# Patient Record
Sex: Male | Born: 1975 | Race: White | Hispanic: No | Marital: Married | Smoking: Former smoker
Health system: Southern US, Community
[De-identification: ages and names within clinical notes are randomized; demographics above are authoritative.]

## PROBLEM LIST (undated history)

## (undated) DIAGNOSIS — M069 Rheumatoid arthritis, unspecified: Secondary | ICD-10-CM

## (undated) DIAGNOSIS — F32A Depression, unspecified: Secondary | ICD-10-CM

## (undated) DIAGNOSIS — T7840XA Allergy, unspecified, initial encounter: Secondary | ICD-10-CM

## (undated) HISTORY — DX: Allergy, unspecified, initial encounter: T78.40XA

## (undated) HISTORY — DX: Depression, unspecified: F32.A

## (undated) HISTORY — DX: Rheumatoid arthritis, unspecified: M06.9

---

## 2008-06-27 HISTORY — PX: VASECTOMY: SHX75

## 2017-07-24 DIAGNOSIS — F32A Depression, unspecified: Secondary | ICD-10-CM | POA: Insufficient documentation

## 2017-07-24 DIAGNOSIS — R110 Nausea: Secondary | ICD-10-CM | POA: Insufficient documentation

## 2017-07-24 DIAGNOSIS — E161 Other hypoglycemia: Secondary | ICD-10-CM | POA: Insufficient documentation

## 2018-03-20 DIAGNOSIS — Z79899 Other long term (current) drug therapy: Secondary | ICD-10-CM | POA: Diagnosis not present

## 2018-03-20 DIAGNOSIS — F3342 Major depressive disorder, recurrent, in full remission: Secondary | ICD-10-CM | POA: Insufficient documentation

## 2018-03-20 DIAGNOSIS — F5102 Adjustment insomnia: Secondary | ICD-10-CM | POA: Diagnosis not present

## 2018-03-20 DIAGNOSIS — Z23 Encounter for immunization: Secondary | ICD-10-CM | POA: Diagnosis not present

## 2018-03-20 DIAGNOSIS — M069 Rheumatoid arthritis, unspecified: Secondary | ICD-10-CM | POA: Diagnosis not present

## 2018-05-18 DIAGNOSIS — Z6826 Body mass index (BMI) 26.0-26.9, adult: Secondary | ICD-10-CM | POA: Diagnosis not present

## 2018-05-18 DIAGNOSIS — R5383 Other fatigue: Secondary | ICD-10-CM | POA: Diagnosis not present

## 2018-05-18 DIAGNOSIS — M542 Cervicalgia: Secondary | ICD-10-CM | POA: Diagnosis not present

## 2018-05-18 DIAGNOSIS — M0600 Rheumatoid arthritis without rheumatoid factor, unspecified site: Secondary | ICD-10-CM | POA: Diagnosis not present

## 2018-05-18 DIAGNOSIS — E663 Overweight: Secondary | ICD-10-CM | POA: Diagnosis not present

## 2018-06-28 DIAGNOSIS — M797 Fibromyalgia: Secondary | ICD-10-CM | POA: Diagnosis not present

## 2018-06-28 DIAGNOSIS — M0600 Rheumatoid arthritis without rheumatoid factor, unspecified site: Secondary | ICD-10-CM | POA: Diagnosis not present

## 2018-06-28 DIAGNOSIS — R945 Abnormal results of liver function studies: Secondary | ICD-10-CM | POA: Diagnosis not present

## 2018-06-28 DIAGNOSIS — M150 Primary generalized (osteo)arthritis: Secondary | ICD-10-CM | POA: Diagnosis not present

## 2019-01-12 ENCOUNTER — Other Ambulatory Visit: Payer: Self-pay | Admitting: Critical Care Medicine

## 2019-01-12 DIAGNOSIS — Z20822 Contact with and (suspected) exposure to covid-19: Secondary | ICD-10-CM

## 2019-01-16 LAB — NOVEL CORONAVIRUS, NAA: SARS-CoV-2, NAA: NOT DETECTED
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
## 2020-03-25 ENCOUNTER — Encounter: Payer: Self-pay | Admitting: Family Medicine

## 2020-03-25 ENCOUNTER — Other Ambulatory Visit: Payer: Self-pay

## 2020-03-25 ENCOUNTER — Ambulatory Visit (INDEPENDENT_AMBULATORY_CARE_PROVIDER_SITE_OTHER): Payer: Commercial Managed Care - PPO | Admitting: Family Medicine

## 2020-03-25 VITALS — BP 120/70 | HR 84 | Ht 71.0 in | Wt 193.0 lb

## 2020-03-25 DIAGNOSIS — M069 Rheumatoid arthritis, unspecified: Secondary | ICD-10-CM

## 2020-03-25 DIAGNOSIS — M999 Biomechanical lesion, unspecified: Secondary | ICD-10-CM | POA: Diagnosis not present

## 2020-03-25 DIAGNOSIS — G8929 Other chronic pain: Secondary | ICD-10-CM

## 2020-03-25 DIAGNOSIS — M5030 Other cervical disc degeneration, unspecified cervical region: Secondary | ICD-10-CM | POA: Insufficient documentation

## 2020-03-25 DIAGNOSIS — M542 Cervicalgia: Secondary | ICD-10-CM | POA: Diagnosis not present

## 2020-03-25 DIAGNOSIS — M0600 Rheumatoid arthritis without rheumatoid factor, unspecified site: Secondary | ICD-10-CM | POA: Insufficient documentation
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
## 2020-03-25 NOTE — Patient Instructions (Addendum)
Good to see you Scapular exercises Neck xray on the way out  Turmeric 500mg  daily  Tart cherry extract 1200mg  at night Vitamin D 2000 IU daily  everyly well for food sensitivity See me again in 5-6 weeks

## 2020-03-25 NOTE — Assessment & Plan Note (Signed)
Per patient.  Will await other notes to further evaluate

## 2020-03-25 NOTE — Assessment & Plan Note (Signed)
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  X-rays are pending.  Underlying rheumatoid arthritis does make it a little complicated.  Not taking significant number of medications at this moment.  We discussed over-the-counter medicines and patient would want to do that.  Has difficulty with gabapentin with hallucinations.  Patient will work on Financial controller.  Work with Event organiser, follow-up again in 4 to 6 weeks.  Responded well to manipulation.

## 2020-03-27 ENCOUNTER — Ambulatory Visit (INDEPENDENT_AMBULATORY_CARE_PROVIDER_SITE_OTHER): Payer: Commercial Managed Care - PPO

## 2020-03-27 DIAGNOSIS — G8929 Other chronic pain: Secondary | ICD-10-CM

## 2020-03-27 DIAGNOSIS — M542 Cervicalgia: Secondary | ICD-10-CM

## 2020-04-30 ENCOUNTER — Encounter: Payer: Self-pay | Admitting: Family Medicine

## 2020-04-30 ENCOUNTER — Ambulatory Visit (INDEPENDENT_AMBULATORY_CARE_PROVIDER_SITE_OTHER): Payer: Commercial Managed Care - PPO | Admitting: Family Medicine

## 2020-04-30 ENCOUNTER — Other Ambulatory Visit: Payer: Self-pay

## 2020-04-30 VITALS — BP 110/68 | HR 71 | Ht 71.0 in | Wt 193.0 lb

## 2020-04-30 DIAGNOSIS — M5030 Other cervical disc degeneration, unspecified cervical region: Secondary | ICD-10-CM

## 2020-04-30 DIAGNOSIS — M999 Biomechanical lesion, unspecified: Secondary | ICD-10-CM | POA: Diagnosis not present
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
## 2020-04-30 NOTE — Assessment & Plan Note (Signed)
Patient does have some degenerative disc disease.  X-rays did show mild to moderate.  Patient does have some mild radicular symptoms but appears.  He did not respond extremely well to osteopathic manipulation last time he states.  We will try again and see how patient does.  Encouraged him to continue the exercises and the vitamin supplementation otherwise we will need to consider the possibility of prescription medications.  Follow-up with me again in 6 to 8 weeks
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
## 2020-04-30 NOTE — Patient Instructions (Signed)
Thanks for tip on ear buds Overall keep being active No more headstands No big changes at this time See me in 6-8 weeks
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
## 2020-06-05 ENCOUNTER — Other Ambulatory Visit: Payer: Self-pay

## 2020-06-05 ENCOUNTER — Ambulatory Visit (INDEPENDENT_AMBULATORY_CARE_PROVIDER_SITE_OTHER): Payer: Commercial Managed Care - PPO | Admitting: Family Medicine

## 2020-06-05 ENCOUNTER — Encounter: Payer: Self-pay | Admitting: Family Medicine

## 2020-06-05 VITALS — BP 118/68 | HR 98 | Ht 71.0 in | Wt 191.0 lb

## 2020-06-05 DIAGNOSIS — M999 Biomechanical lesion, unspecified: Secondary | ICD-10-CM

## 2020-06-05 DIAGNOSIS — M5030 Other cervical disc degeneration, unspecified cervical region: Secondary | ICD-10-CM
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
## 2020-06-05 NOTE — Patient Instructions (Signed)
Keep active Keep working posture See me in 2 months

## 2020-06-18 ENCOUNTER — Other Ambulatory Visit: Payer: Self-pay

## 2020-06-18 DIAGNOSIS — Z20822 Contact with and (suspected) exposure to covid-19: Secondary | ICD-10-CM

## 2020-06-20 LAB — SARS-COV-2, NAA 2 DAY TAT

## 2020-06-20 LAB — NOVEL CORONAVIRUS, NAA: SARS-CoV-2, NAA: NOT DETECTED

## 2020-08-13 NOTE — Progress Notes (Signed)
[facility] [address] [place] [address] Phone: [phone] Subjective:   Darrell Jordan, am serving as a scribe for Dr. [name]. This visit occurred during the SARS-CoV-2 public health emergency.  Safety protocols were in place, including screening questions prior to the visit, additional usage of staff PPE, and extensive cleaning of exam room while observing appropriate contact time as indicated for disinfecting solutions.   I'm seeing this patient by the request  of:  Roger Kill, PA-C  CC: neck pain follow up   [id number]  Darrell Jordan is a 45 y.o. male coming in with complaint of back and neck pain. OMT 06/05/2020. Patient states that his neck pain was doing great until this week. Noticed that black beans and Chiptle has made his neck hurt more this week.   Medications patient has been prescribed: None          Reviewed prior external information including notes and imaging from previsou exam, outside providers and external EMR if available.   As well as notes that were available from care everywhere and other healthcare systems.  Past medical history, social, surgical and family history all reviewed in electronic medical record.  No pertanent information unless stated regarding to the chief complaint.   No past medical history on file.  Allergies  Allergen Reactions  . Gabapentin      Review of Systems:  No headache, visual changes, nausea, vomiting, diarrhea, constipation, dizziness, abdominal pain, skin rash, fevers, chills, night sweats, weight loss, swollen lymph nodes, body aches, joint swelling, chest pain, shortness of breath, mood changes. POSITIVE muscle aches  Objective  Blood pressure 122/80, pulse 72, height 5\' 11"  (1.803 m), weight 196 lb (88.9 kg), SpO2 96 %.   General: No apparent distress alert and oriented x3 mood and affect normal, dressed appropriately.  HEENT: Pupils equal, extraocular  movements intact  Respiratory: Patient's speak in full sentences and does not appear short of breath  Cardiovascular: No lower extremity edema, non tender, no erythema  Neuro: Cranial nerves II through XII are intact, neurovascularly intact in all extremities with 2+ DTRs and 2+ pulses.  Gait normal with good balance and coordination.  MSK:  Non tender with full range of motion and good stability and symmetric strength and tone of shoulders, elbows, wrist, hip, knee and ankles bilaterally.   neck exam   Osteopathic findings  C2 flexed rotated and side bent right C6 flexed rotated and side bent left T3 extended rotated and side bent right inhaled rib T9 extended rotated and side bent left        Assessment and Plan:  Degenerative cervical disc Mild exacerbation of a underlying chronic problem.  Patient does have history of rheumatoid arthritis but I do not think patient is having a flare.  Patient responded well to muscle energy.  Likely more secondary to recent stress, potentially diet, and weather changes.  We will continue conservative therapy including osteopathic manipulation with a short course of anti-inflammatories and follow-up with me again in 4 to 6 weeks and then hopefully space out back to 8-week intervals.    Nonallopathic problems  Decision today to treat with OMT was based on Physical Exam  After verbal consent patient was treated with ME, FPR techniques in cervical, rib, thoracic areas  Patient tolerated the procedure well with improvement in symptoms  Patient given exercises, stretches and lifestyle modifications  See medications in patient instructions if given  Patient will follow up  in 4-6 weeks      The above documentation has been reviewed and is accurate and complete Judi Saa, DO       Note: This dictation was prepared with Dragon dictation along with smaller phrase technology. Any transcriptional errors that result from this process are  unintentional.

## 2020-08-14 ENCOUNTER — Other Ambulatory Visit: Payer: Self-pay

## 2020-08-14 ENCOUNTER — Encounter: Payer: Self-pay | Admitting: Family Medicine

## 2020-08-14 ENCOUNTER — Ambulatory Visit: Payer: 59 | Admitting: Family Medicine

## 2020-08-14 VITALS — BP 122/80 | HR 72 | Ht 71.0 in | Wt 196.0 lb

## 2020-08-14 DIAGNOSIS — M5030 Other cervical disc degeneration, unspecified cervical region: Secondary | ICD-10-CM | POA: Diagnosis not present

## 2020-08-14 DIAGNOSIS — M999 Biomechanical lesion, unspecified: Secondary | ICD-10-CM

## 2020-08-14 NOTE — Patient Instructions (Signed)
2 IBU 2x a day for 3 days Go to town with massage on right side of neck Consider yoga wheel Have other doc check uric acid See me in 4-6 weeks

## 2020-08-14 NOTE — Assessment & Plan Note (Signed)
Mild exacerbation of a underlying chronic problem.  Patient does have history of rheumatoid arthritis but I do not think patient is having a flare.  Patient responded well to muscle energy.  Likely more secondary to recent stress, potentially diet, and weather changes.  We will continue conservative therapy including osteopathic manipulation with a short course of anti-inflammatories and follow-up with me again in 4 to 6 weeks and then hopefully space out back to 8-week intervals.

## 2020-09-23 NOTE — Progress Notes (Signed)
[name] [address] [place] [address] Phone: [phone] Subjective:   Darrell Jordan, am serving as a scribe for Dr. [name]. This visit occurred during the SARS-CoV-2 public health emergency.  Safety protocols were in place, including screening questions prior to the visit, additional usage of staff PPE, and extensive cleaning of exam room while observing appropriate contact time as indicated for disinfecting solutions.   I'm seeing this patient by the request  of:  Roger Kill, PA-C  CC: Back pain and neck pain follow-up  NIO:[id number]  Darrell Jordan is a 45 y.o. male coming in with complaint of back and neck pain. OMT 08/14/2020. Patient states that his pain throughout his body has been more the past few weeks as well as fatigued.  Patient does have known rheumatoid arthritis.  Patient states that he would like to discuss potential care with another rheumatologist.  Patient just does not feel like himself.  Medications patient has been prescribed: None          Reviewed prior external information including notes and imaging from previsou exam, outside providers and external EMR if available.   As well as notes that were available from care everywhere and other healthcare systems.  Past medical history, social, surgical and family history all reviewed in electronic medical record.  No pertanent information unless stated regarding to the chief complaint.   No past medical history on file.  Allergies  Allergen Reactions  . Gabapentin      Review of Systems:  No headache, visual changes, nausea, vomiting, diarrhea, constipation, dizziness, abdominal pain, skin rash, fevers, chills, night sweats, weight loss, swollen lymph nodes, joint swelling, chest pain, shortness of breath, mood changes. POSITIVE muscle aches, body aches, fatigue  Objective  Blood pressure 120/80, pulse 68, height 5\' 11"  (1.803 m), weight 197 lb  (89.4 kg), SpO2 97 %.   General: No apparent distress alert and oriented x3 mood and affect normal, dressed appropriately.  HEENT: Pupils equal, extraocular movements intact  Respiratory: Patient's speak in full sentences and does not appear short of breath  Cardiovascular: No lower extremity edema, non tender, no erythema  Gait normal with good balance and coordination.  MSK: Patient does have some mild synovitis of the hands bilaterally. Back -low back exam shows mild loss of lordosis.  Patient is minorly tender overall. Neck exam does have tightness noted.  Negative Spurling's.  Patient has 5 out of 5 strength of the hands the patient does have some mild swelling in his heels with some mild synovitis of the hands.  Neurovascularly intact.  Good capillary refill.  Good strength at the shoulders.  Mild limitation of extension of the neck  Osteopathic findings  C6 flexed rotated and side bent left T3 extended rotated and side bent right inhaled rib T9 extended rotated and side bent left L2 flexed rotated and side bent right Sacrum right on right       Assessment and Plan:  Rheumatoid arthritis (HCC) History of rheumatoid arthritis.  Originally diagnosed in .  Has been seen at [facility]  Seeing different providers before.  We will get more repeat labs to see if patient is having a potential flare.  Would like to refer patient to another rheumatologist for another opinion and treatment.  Patient does have mild increase in swelling.  We will start meloxicam and examining the patient would like to keep at very low doses and not doing long-term.  Follow-up with  me again in 4 weeks    Nonallopathic problems  Decision today to treat with OMT was based on Physical Exam  After verbal consent patient was treated with HVLA, ME, FPR techniques in cervical, rib, thoracic, lumbar, and sacral  areas avoided HVLA on the neck values more muscle energy  Patient tolerated the procedure well  with improvement in symptoms  Patient given exercises, stretches and lifestyle modifications  See medications in patient instructions if given  Patient will follow up in 4-8 weeks      The above documentation has been reviewed and is accurate and complete Judi Saa, DO       Note: This dictation was prepared with Dragon dictation along with smaller phrase technology. Any transcriptional errors that result from this process are unintentional.

## 2020-09-24 ENCOUNTER — Ambulatory Visit: Payer: 59 | Admitting: Family Medicine

## 2020-09-24 ENCOUNTER — Other Ambulatory Visit: Payer: Self-pay

## 2020-09-24 ENCOUNTER — Encounter: Payer: Self-pay | Admitting: Family Medicine

## 2020-09-24 VITALS — BP 120/80 | HR 68 | Ht 71.0 in | Wt 197.0 lb

## 2020-09-24 DIAGNOSIS — T7840XD Allergy, unspecified, subsequent encounter: Secondary | ICD-10-CM | POA: Diagnosis not present

## 2020-09-24 DIAGNOSIS — M999 Biomechanical lesion, unspecified: Secondary | ICD-10-CM

## 2020-09-24 DIAGNOSIS — M069 Rheumatoid arthritis, unspecified: Secondary | ICD-10-CM

## 2020-09-24 DIAGNOSIS — M2550 Pain in unspecified joint: Secondary | ICD-10-CM

## 2020-09-24 LAB — CBC WITH DIFFERENTIAL/PLATELET
Basophils Absolute: 0.1 10*3/uL (ref 0.0–0.1)
Basophils Relative: 1.1 % (ref 0.0–3.0)
Eosinophils Absolute: 0.1 10*3/uL (ref 0.0–0.7)
Eosinophils Relative: 0.8 % (ref 0.0–5.0)
HCT: 45.1 % (ref 39.0–52.0)
Hemoglobin: 15.3 g/dL (ref 13.0–17.0)
Lymphocytes Relative: 12.3 % (ref 12.0–46.0)
Lymphs Abs: 1 10*3/uL (ref 0.7–4.0)
MCHC: 34 g/dL (ref 30.0–36.0)
MCV: 94.6 fl (ref 78.0–100.0)
Monocytes Absolute: 0.5 10*3/uL (ref 0.1–1.0)
Monocytes Relative: 6.1 % (ref 3.0–12.0)
Neutro Abs: 6.4 10*3/uL (ref 1.4–7.7)
Neutrophils Relative %: 79.7 % — ABNORMAL HIGH (ref 43.0–77.0)
Platelets: 140 10*3/uL — ABNORMAL LOW (ref 150.0–400.0)
RBC: 4.77 Mil/uL (ref 4.22–5.81)
RDW: 12.4 % (ref 11.5–15.5)
WBC: 8.1 10*3/uL (ref 4.0–10.5)

## 2020-09-24 LAB — TESTOSTERONE: Testosterone: 378.52 ng/dL (ref 300.00–890.00)

## 2020-09-24 LAB — VITAMIN B12: Vitamin B-12: 237 pg/mL (ref 211–911)

## 2020-09-24 LAB — IBC PANEL
Iron: 90 ug/dL (ref 42–165)
Saturation Ratios: 22.7 % (ref 20.0–50.0)
Transferrin: 283 mg/dL (ref 212.0–360.0)

## 2020-09-24 LAB — URIC ACID: Uric Acid, Serum: 6.9 mg/dL (ref 4.0–7.8)

## 2020-09-24 LAB — SEDIMENTATION RATE: Sed Rate: 5 mm/hr (ref 0–15)

## 2020-09-24 LAB — TSH: TSH: 1.91 u[IU]/mL (ref 0.35–4.50)

## 2020-09-24 LAB — PSA: PSA: 0.5 ng/mL (ref 0.10–4.00)

## 2020-09-24 LAB — VITAMIN D 25 HYDROXY (VIT D DEFICIENCY, FRACTURES): VITD: 24.52 ng/mL — ABNORMAL LOW (ref 30.00–100.00)

## 2020-09-24 MED ORDER — TIZANIDINE HCL 2 MG PO TABS: 2.0000 mg | ORAL_TABLET | Freq: Every day | ORAL | 0 refills | Status: DC

## 2020-09-24 MED ORDER — MELOXICAM 7.5 MG PO TABS
7.5000 mg | ORAL_TABLET | Freq: Every day | ORAL | 0 refills | Status: DC
Start: 2020-09-24 — End: 2020-11-26

## 2020-09-24 NOTE — Addendum Note (Signed)
Addended by: Madelon Lips on: 09/24/2020 11:32 AM   Modules accepted: Orders

## 2020-09-24 NOTE — Assessment & Plan Note (Signed)
History of rheumatoid arthritis.  Originally diagnosed in [place].  Has been seen at [facility].  Seeing different providers before.  We will get more repeat labs to see if patient is having a potential flare.  Would like to refer patient to another rheumatologist for another opinion and treatment.  Patient does have mild increase in swelling.  We will start meloxicam and examining the patient would like to keep at very low doses and not doing long-term.  Follow-up with me again in 4 weeks

## 2020-09-24 NOTE — Patient Instructions (Addendum)
Meloxicam 7.5-10 days in a row then as needed Zanaflex 2mg  at night Rheum will call you Allergy will call you See me again in 4-6 weeks

## 2020-09-24 NOTE — Addendum Note (Signed)
Addended by: Ethlyn Daniels on: 09/24/2020 11:28 AM   Modules accepted: Orders

## 2020-09-25 LAB — ANA: Anti Nuclear Antibody (ANA): NEGATIVE

## 2020-09-25 LAB — PTH, INTACT AND CALCIUM
Calcium: 9.8 mg/dL (ref 8.6–10.3)
PTH: 59 pg/mL (ref 16–77)

## 2020-09-25 LAB — CYCLIC CITRUL PEPTIDE ANTIBODY, IGG: Cyclic Citrullin Peptide Ab: <16 UNITS

## 2020-09-25 LAB — RHEUMATOID FACTOR: Rheumatoid fact SerPl-aCnc: <14 IU/mL (ref <14)

## 2020-10-08 ENCOUNTER — Telehealth: Payer: Self-pay | Admitting: Physician Assistant

## 2020-10-08 NOTE — Telephone Encounter (Signed)
ok 

## 2020-10-08 NOTE — Telephone Encounter (Signed)
Patient wife called and wanted to see if the provider can take husband as a new patient, please advise. CB is [phone]

## 2020-11-02 NOTE — Progress Notes (Signed)
[name] [address] [place] [address] Phone: [phone] Subjective:   Darrell Jordan, am serving as a scribe for Dr. [name]. This visit occurred during the SARS-CoV-2 public health emergency.  Safety protocols were in place, including screening questions prior to the visit, additional usage of staff PPE, and extensive cleaning of exam room while observing appropriate contact time as indicated for disinfecting solutions.   I'm seeing this patient by the request  of:  Roger Kill, PA-C  CC: Neck and back pain follow-up  ION:[id number]  Joeziah Voit is a 45 y.o. male coming in with complaint of back and neck pain. OMT 09/24/2020. Patient states that last week he was not feeling good. Pain increased in neck and L shoulder. Pain improved over weekend after ice and rest. This week his pain is much better. Taking meloxicam prn. Does not like zanaflex as this makes him angry.   Has not been scheduled yet with Dr. Nigel Sloop but they did call him.   Medications patient has been prescribed: Meloxicam, Zanaflex          Reviewed prior external information including notes and imaging from previsou exam, outside providers and external EMR if available.   As well as notes that were available from care everywhere and other healthcare systems.  Past medical history, social, surgical and family history all reviewed in electronic medical record.  No pertanent information unless stated regarding to the chief complaint.   No past medical history on file.  Allergies  Allergen Reactions  . Gabapentin      Review of Systems:  No headache, visual changes, nausea, vomiting, diarrhea, constipation, dizziness, abdominal pain, skin rash, fevers, chills, night sweats, weight loss, swollen lymph nodes, body aches, joint swelling, chest pain, shortness of breath, mood changes. POSITIVE muscle aches  Objective  Blood pressure 114/70, pulse 66,  height 5\' 11"  (1.803 m), weight 201 lb (91.2 kg), SpO2 97 %.   General: No apparent distress alert and oriented x3 mood and affect normal, dressed appropriately.  HEENT: Pupils equal, extraocular movements intact  Respiratory: Patient's speak in full sentences and does not appear short of breath  Cardiovascular: No lower extremity edema, non tender, no erythema .  Gait normal with good balance and coordination.  MSK:  Non tender with full range of motion and good stability and symmetric strength and tone of shoulders, elbows, wrist, hip, knee and ankles bilaterally.  Back-back exam does have some mild loss of lordosis.  Patient will do some more tightness around the neck to left greater than right today.  Seems to be more at the base of the skull.  Patient does have tightness in the left scapular region as well.  Osteopathic findings  C2 flexed rotated and side bent left T4 extended rotated and side bent left inhaled ribht       Assessment and Plan:  Degenerative cervical disc Patient continues to have some tightness.  He does have the muscle relaxer for breakthrough.  Discussed with patient icing regimen.  Awaiting patient to see rheumatology.  Follow-up with me again in 6 to 8 weeks chronic problem with very mild exacerbation.  Rheumatoid arthritis (HCC) Awaiting further evaluation from rheumatology.    Nonallopathic problems  Decision today to treat with OMT was based on Physical Exam  After verbal consent patient was treated with ME, FPR techniques in cervical, rib, thoracic  areas  Patient tolerated the procedure well with improvement in symptoms  Patient  given exercises, stretches and lifestyle modifications  See medications in patient instructions if given  Patient will follow up in 4-8 weeks      The above documentation has been reviewed and is accurate and complete Judi Saa, DO       Note: This dictation was prepared with Dragon dictation along with  smaller phrase technology. Any transcriptional errors that result from this process are unintentional.

## 2020-11-03 ENCOUNTER — Encounter: Payer: Self-pay | Admitting: Family Medicine

## 2020-11-03 ENCOUNTER — Other Ambulatory Visit: Payer: Self-pay

## 2020-11-03 ENCOUNTER — Ambulatory Visit: Payer: 59 | Admitting: Family Medicine

## 2020-11-03 VITALS — BP 114/70 | HR 66 | Ht 71.0 in | Wt 201.0 lb

## 2020-11-03 DIAGNOSIS — M9901 Segmental and somatic dysfunction of cervical region: Secondary | ICD-10-CM

## 2020-11-03 DIAGNOSIS — M9902 Segmental and somatic dysfunction of thoracic region: Secondary | ICD-10-CM | POA: Diagnosis not present

## 2020-11-03 DIAGNOSIS — M5030 Other cervical disc degeneration, unspecified cervical region: Secondary | ICD-10-CM | POA: Diagnosis not present

## 2020-11-03 DIAGNOSIS — M9908 Segmental and somatic dysfunction of rib cage: Secondary | ICD-10-CM | POA: Diagnosis not present

## 2020-11-03 DIAGNOSIS — M069 Rheumatoid arthritis, unspecified: Secondary | ICD-10-CM

## 2020-11-03 NOTE — Assessment & Plan Note (Signed)
Patient continues to have some tightness.  He does have the muscle relaxer for breakthrough.  Discussed with patient icing regimen.  Awaiting patient to see rheumatology.  Follow-up with me again in 6 to 8 weeks chronic problem with very mild exacerbation.

## 2020-11-03 NOTE — Assessment & Plan Note (Signed)
Awaiting further evaluation from rheumatology.

## 2020-11-03 NOTE — Patient Instructions (Signed)
Just a little inflamed Let's not make any changes See other docs See me in 6-8 weeks

## 2020-11-26 ENCOUNTER — Other Ambulatory Visit: Payer: Self-pay | Admitting: Family Medicine

## 2020-11-26 ENCOUNTER — Other Ambulatory Visit: Payer: Self-pay | Admitting: Allergy & Immunology

## 2020-11-26 ENCOUNTER — Encounter: Payer: Self-pay | Admitting: Allergy & Immunology

## 2020-11-26 ENCOUNTER — Other Ambulatory Visit: Payer: Self-pay

## 2020-11-26 ENCOUNTER — Telehealth: Payer: Self-pay

## 2020-11-26 ENCOUNTER — Ambulatory Visit (INDEPENDENT_AMBULATORY_CARE_PROVIDER_SITE_OTHER): Payer: 59 | Admitting: Allergy & Immunology

## 2020-11-26 VITALS — BP 118/60 | HR 58 | Temp 98.4°F | Resp 18 | Ht 71.0 in | Wt 195.2 lb

## 2020-11-26 DIAGNOSIS — L301 Dyshidrosis [pompholyx]: Secondary | ICD-10-CM | POA: Diagnosis not present

## 2020-11-26 DIAGNOSIS — J4520 Mild intermittent asthma, uncomplicated: Secondary | ICD-10-CM | POA: Diagnosis not present

## 2020-11-26 DIAGNOSIS — J310 Chronic rhinitis: Secondary | ICD-10-CM | POA: Diagnosis not present

## 2020-11-26 DIAGNOSIS — T7800XD Anaphylactic reaction due to unspecified food, subsequent encounter: Secondary | ICD-10-CM

## 2020-11-26 MED ORDER — TRIAMCINOLONE ACETONIDE 0.1 % EX OINT: 1.0000 "application " | TOPICAL_OINTMENT | Freq: Two times a day (BID) | CUTANEOUS | 2 refills | Status: DC | PRN

## 2020-11-26 MED ORDER — MELOXICAM 7.5 MG PO TABS: 7.5000 mg | ORAL_TABLET | Freq: Every day | ORAL | 0 refills | Status: DC

## 2020-11-26 NOTE — Progress Notes (Signed)
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 He has a history of multiple food allergies, but the time course of the reactions (4-6 hours after eating) is not truly consistent with an IgE-mediated process. Regardless, we are going to perform some bloodwork to check on his IgE levels to his triggering foods. I would like to get his outside records as well, so we will do that. He has not gotten an up-to-date epinephrine autoinjector in quite some time, so we will arrange for him to you get an Auvi-Q. Regarding his breathing, his symptoms seem to be under control with albuterol as needed.  I do not think there is an indication for controller medication at this time.  Regarding his " tired immune system", I believe he is more concerned about his rheumatoid arthritis.  It does not seem that he has any recurrent infections, so I am going to defer on lab work for that today.   Plan/Recommendations:   1. Anaphylactic shock due to food - We are going to get lab work to look for evidence of new allergies and see where you other allergy levels are hanging out. - We are also getting a tryptase to make sure that you do not have any mast cell issues (which I think is unlikely).  - We will call you in 1-2 weeks with the results of the testing. Darrell Jordan training provided. - Anaphylaxis management plan provided.   2. Mild intermittent asthma, uncomplicated - Lung testing looks awesome - Continue with albuterol as needed. - There is no need for a controller medication at this time.  3.  Environmental allergies - We are getting an environmental allergy panel since your symptoms have been so bad this year.   4. Dyshidrotic eczema - Start triamcinolone 0.1% ointment twice daily as needed to the rash on his fingers. - Provided information on dyshidrotic eczema.  5. Return in about 6 months (around 05/28/2021).    This note in its entirety was forwarded to the Provider who requested this consultation.  Subjective:   Darrell Jordan is a 45 y.o. male presenting today for evaluation of  Chief Complaint  Patient presents with  . Allergy Testing    Darrell Jordan has a history of the following: Patient Active Problem List   Diagnosis Date Noted  . Dyshidrotic eczema 11/28/2020  . Chronic rhinitis 11/28/2020  . Mild intermittent asthma, uncomplicated 11/28/2020  . Anaphylactic shock due to adverse food reaction 11/28/2020  . Degenerative cervical disc 03/25/2020  . Nonallopathic lesion of cervical region 03/25/2020  . Nonallopathic lesion of thoracic region 03/25/2020  . Nonallopathic lesion of rib cage 03/25/2020  . Rheumatoid arthritis (HCC) 03/25/2020    History obtained from: chart review and patient.  Darrell Jordan was referred by Wynn Banker, MD.     [name] is a 45 y.o. male presenting for an evaluation of food allergies.   Asthma/Respiratory Symptom History: He does have a history of wheezing occasionally.  This seems to be only a problem with  viral upper respiratory infections.  He has never been on a controller medication.  He  has an albuterol inhaler to use on a as needed basis.  ACT score is 25 today, indicating excellent asthma control.  Allergic Rhinitis Symptom History: He did have pollen allergies that were worse than normal. He uses a Netti pot and Zyrtec during the owrst times of the year. He doesn ot get sinus infections. In April he was ill and did not have COVID and had a terrible head cold. Allermax and Mucinex D knocked it out.   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 Corn and egg entered the picture in 2016 or 2017. This is when he started having symptoms of RA. Soy and peanut results in really terrible diarrhea for two weeks. Prior to that, he had vomiting 4-5 hours after eating the food. It is never within 30-60 minutes. Last testing was done in [place]. This was skin testing and he evidently had anaphylaxis from the testing.  Similar to his food reactions, the anaphylaxis did not occur until 4 to 6 hours after the testing.  It is unclear whether this was an asthma and allergy practice or some different practice.  He started breaking out again in February or March 2022. He is meticulous about his food.  He is unsure of the trigger at this point.  He also reports today that his "immune system is tired". He started having fatigue randomly. [facility]. He has a history of multiple injuries which is why he follows with Dr. Katrinka Jordan. RA symptoms started showing up after a neck injury when he lived in [place]. He has had some increased levels of pain.  Evidently, hearing more about his RA diagnosis, he is now off of all Biologics for this including methotrexate.  He does have meloxicam to use as needed as well as Zanaflex. He avoids "pro-inflammatory" foods.  He has been referred for another rheumatology evaluation to see if there is some other treatment options.   Otherwise, there is no history of other atopic diseases, including food allergies, drug allergies, stinging insect allergies, urticaria or contact dermatitis. There is no significant infectious history. Vaccinations are up to date.    Past Medical History: Patient Active Problem List   Diagnosis Date Noted  . Dyshidrotic eczema 11/28/2020  . Chronic rhinitis 11/28/2020  . Mild intermittent asthma, uncomplicated 11/28/2020  . Anaphylactic shock due to adverse food  reaction 11/28/2020  . Degenerative cervical disc 03/25/2020  . Nonallopathic lesion of cervical region 03/25/2020  . Nonallopathic lesion of thoracic region 03/25/2020  . Nonallopathic lesion of rib cage 03/25/2020  . Rheumatoid arthritis (HCC) 03/25/2020    Medication List:  Allergies as of 11/26/2020      Reactions   Gabapentin       Medication List       Accurate as of November 26, 2020 11:59 PM. If you have any questions, ask your nurse or doctor.        albuterol 108 (90 Base) MCG/ACT inhaler Commonly known as: VENTOLIN HFA Inhale 2 puffs into the lungs every 6 (six) hours as needed.   meloxicam 7.5 MG tablet Commonly known as: MOBIC Take 1 tablet (7.5 mg total) by mouth daily.   tiZANidine 2 MG tablet Commonly known as: ZANAFLEX Take 1 tablet (2 mg total) by mouth at bedtime.   triamcinolone ointment 0.1 % Commonly known as: KENALOG Apply 1 application topically 2 (two) times daily as needed.  Started by: Alfonse Spruce, MD       Birth History: non-contributory  Developmental History: non-contributory  Past Surgical History: History reviewed. No pertinent surgical history.   Family History: History reviewed. No pertinent family history.   Social History: Shaman lives at home with his family.  He lives in a house that is 46 years old.  There is wood throughout the home.  He has a heat pump for heating and central cooling.  There are cats inside of the home.  There are no dust mite covers on the bedding.  There is no tobacco exposure.  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  Evidently there are 2 large companies that make mobile medical equipment and they are both located here in [place].  He seems to really enjoy his job.  He is a never smoker.  He does use a HEPA filter in the home.  He is exposed to fumes and chemicals in his workplace, but not at home.   Review of Systems  Constitutional: Negative.  Negative  for chills, fever, malaise/fatigue and weight loss.  HENT: Negative for congestion, ear discharge, ear pain and sinus pain.   Eyes: Negative for pain, discharge and redness.  Respiratory: Negative for cough, sputum production, shortness of breath and wheezing.   Cardiovascular: Negative.  Negative for chest pain and palpitations.  Gastrointestinal: Negative for abdominal pain, constipation, diarrhea, heartburn, nausea and vomiting.  Skin: Negative.  Negative for itching and rash.  Neurological: Negative for dizziness and headaches.  Endo/Heme/Allergies: Positive for environmental allergies. Does not bruise/bleed easily.       Positive for food allergies.       Objective:   Blood pressure 118/60, pulse (!) 58, temperature 98.4 F (36.9 C), temperature source Temporal, resp. rate 18, height  (1.803 m), weight 195 lb 3.2 oz (88.5 kg), SpO2 98 %. Body mass index is 27.22 kg/m.   Physical Exam:   Physical Exam Constitutional:      Appearance: He is well-developed.     Comments: Very pleasant male. Cooperative with the exam.   HENT:     Head: Normocephalic and atraumatic.     Right Ear: Tympanic membrane, ear canal and external ear normal. No drainage, swelling or tenderness. Tympanic membrane is not injected, scarred, erythematous, retracted or bulging.     Left Ear: Tympanic membrane, ear canal and external ear normal. No drainage, swelling or tenderness. Tympanic membrane is not injected, scarred, erythematous, retracted or bulging.     Nose: No nasal deformity, septal deviation, mucosal edema or rhinorrhea.     Right Turbinates: Enlarged and swollen.     Left Turbinates: Enlarged and swollen.     Right Sinus: No maxillary sinus tenderness or frontal sinus tenderness.     Left Sinus: No maxillary sinus tenderness or frontal sinus tenderness.     Mouth/Throat:     Mouth: Mucous membranes are not pale and not dry.     Pharynx: Uvula midline.  Eyes:     General:         Right eye: No discharge.        Left eye: No discharge.     Conjunctiva/sclera: Conjunctivae normal.     Right eye: Right conjunctiva is not injected. No chemosis.    Left eye: Left conjunctiva is not injected. No chemosis.    Pupils: Pupils are equal, round, and reactive to light.  Cardiovascular:     Rate and Rhythm:  Normal rate and regular rhythm.     Heart sounds: Normal heart sounds.  Pulmonary:     Effort: Pulmonary effort is normal. No tachypnea, accessory muscle usage or respiratory distress.     Breath sounds: Normal breath sounds. No wheezing, rhonchi or rales.  Chest:     Chest wall: No tenderness.  Abdominal:     Tenderness: There is no abdominal tenderness. There is no guarding or rebound.  Lymphadenopathy:     Head:     Right side of head: No submandibular, tonsillar or occipital adenopathy.     Left side of head: No submandibular, tonsillar or occipital adenopathy.     Cervical: No cervical adenopathy.  Skin:    Coloration: Skin is not pale.     Findings: No abrasion, erythema, petechiae or rash. Rash is not papular, urticarial or vesicular.  Neurological:     Mental Status: He is alert.  Psychiatric:        Behavior: Behavior is cooperative.      Diagnostic studies:    Spirometry: results normal (FEV1: 3.78/96%, FVC: 4.58/93%, FEV1/FVC: 83%).    Spirometry consistent with normal pattern.    Allergy Studies: none (labs sent instead)      Malachi Bonds, MD [facility]

## 2020-11-26 NOTE — Telephone Encounter (Signed)
I added the coconut to his labs and gave them to [place].

## 2020-11-26 NOTE — Patient Instructions (Addendum)
1. Anaphylactic shock due to food - We are going to get lab work to look for evidence of new allergies and see where you other allergy levels are hanging out. - We are also getting a tryptase to make sure that you do not have any mast cell issues (which I think is unlikely).  - We will call you in 1-2 weeks with the results of the testing. Audry Riles training provided. - Anaphylaxis management plan provided.   2. Mild intermittent asthma, uncomplicated - Lung testing looks awesome. - Continue with albuterol as needed. - There is no need for a controller medication at this time.  3. Environmental allergies - We are getting an environmental allergy panel since your symptoms have been so bad this year.   4. Return in about 6 months (around 05/28/2021).    Please inform us of any Emergency Department visits, hospitalizations, or changes in symptoms. Call us before going to the ED for breathing or allergy symptoms since we might be able to fit you in for a sick visit. Feel free to contact us anytime with any questions, problems, or concerns.  It was a pleasure to meet you today!  Websites that have reliable patient information: 1. American Academy of Asthma, Allergy, and Immunology: www.aaaai.org 2. Food Allergy Research and Education (FARE): foodallergy.org 3. Mothers of Asthmatics: http://www.asthmacommunitynetwork.org 4. American College of Allergy, Asthma, and Immunology: www.acaai.org   COVID-19 Vaccine Information can be found at: [email or web address].[email or web address] For questions related to vaccine distribution or appointments, please email [email or web address] or call [phone].   We realize that you might be concerned about having an allergic reaction to the COVID19 vaccines. To help with that concern, WE ARE OFFERING THE COVID19 VACCINES IN OUR OFFICE! Ask the front desk for dates!     "Like" [place] on Facebook and Instagram for our latest  updates!      A healthy democracy works best when [employer] participate! Make sure you are registered to vote! If you have moved or changed any of your contact information, you will need to get this updated before voting!  In some cases, you MAY be able to register to vote online: [email or web address]

## 2020-11-27 NOTE — Telephone Encounter (Signed)
Noted! Thank you

## 2020-11-28 ENCOUNTER — Encounter: Payer: Self-pay | Admitting: Allergy & Immunology

## 2020-11-28 DIAGNOSIS — J4520 Mild intermittent asthma, uncomplicated: Secondary | ICD-10-CM

## 2020-11-28 DIAGNOSIS — T7800XA Anaphylactic reaction due to unspecified food, initial encounter: Secondary | ICD-10-CM | POA: Insufficient documentation

## 2020-11-28 DIAGNOSIS — L301 Dyshidrosis [pompholyx]: Secondary | ICD-10-CM | POA: Insufficient documentation

## 2020-11-28 DIAGNOSIS — J310 Chronic rhinitis: Secondary | ICD-10-CM | POA: Insufficient documentation

## 2020-11-28 HISTORY — DX: Mild intermittent asthma, uncomplicated: J45.20

## 2020-11-30 NOTE — Progress Notes (Signed)
On it! Sent patient a Clinical cytogeneticist message with the Medical Release form attached.   Thanks

## 2020-12-02 ENCOUNTER — Ambulatory Visit: Payer: 59 | Admitting: Family Medicine

## 2020-12-02 ENCOUNTER — Other Ambulatory Visit: Payer: Self-pay

## 2020-12-02 ENCOUNTER — Encounter: Payer: Self-pay | Admitting: Family Medicine

## 2020-12-02 VITALS — BP 100/60 | HR 57 | Temp 98.0°F | Ht 71.0 in | Wt 197.1 lb

## 2020-12-02 DIAGNOSIS — Z91018 Allergy to other foods: Secondary | ICD-10-CM | POA: Diagnosis not present

## 2020-12-02 DIAGNOSIS — J4520 Mild intermittent asthma, uncomplicated: Secondary | ICD-10-CM | POA: Diagnosis not present

## 2020-12-02 DIAGNOSIS — M069 Rheumatoid arthritis, unspecified: Secondary | ICD-10-CM

## 2020-12-02 MED ORDER — TRIAMCINOLONE ACETONIDE 0.1 % EX OINT: 1.0000 "application " | TOPICAL_OINTMENT | Freq: Two times a day (BID) | CUTANEOUS | 2 refills | Status: DC | PRN

## 2020-12-02 MED ORDER — EPINEPHRINE 0.3 MG/0.3ML IJ SOAJ: 0.3000 mg | INTRAMUSCULAR | 11 refills | Status: DC | PRN

## 2020-12-02 NOTE — Addendum Note (Signed)
Addended by: Grier Rocher on: 12/02/2020 09:06 AM   Modules accepted: Orders
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
## 2020-12-03 MED ORDER — TRIAMCINOLONE ACETONIDE 0.1 % EX OINT: 1.0000 "application " | TOPICAL_OINTMENT | Freq: Two times a day (BID) | CUTANEOUS | 2 refills | Status: DC | PRN

## 2020-12-03 NOTE — Addendum Note (Signed)
Addended by: Grier Rocher on: 12/03/2020 02:12 PM   Modules accepted: Orders

## 2020-12-04 LAB — ALLERGENS W/COMP RFLX AREA 2
Alternaria Alternata IgE: <0.1 kU/L
Aspergillus Fumigatus IgE: <0.1 kU/L
Bermuda Grass IgE: 0.43 kU/L — AB
Cedar, Mountain IgE: 0.12 kU/L — AB
Cladosporium Herbarum IgE: <0.1 kU/L
Cockroach, German IgE: <0.1 kU/L
Common Silver Birch IgE: <0.1 kU/L
Cottonwood IgE: <0.1 kU/L
D Farinae IgE: <0.1 kU/L
D Pteronyssinus IgE: <0.1 kU/L
E001-IgE Cat Dander: <0.1 kU/L
E005-IgE Dog Dander: <0.1 kU/L
Elm, American IgE: <0.1 kU/L
IgE (Immunoglobulin E), Serum: 37 IU/mL (ref 6–495)
Johnson Grass IgE: 0.44 kU/L — AB
Maple/Box Elder IgE: <0.1 kU/L
Mouse Urine IgE: <0.1 kU/L
Oak, White IgE: <0.1 kU/L
Pecan, Hickory IgE: <0.1 kU/L
Penicillium Chrysogen IgE: <0.1 kU/L
Pigweed, Rough IgE: <0.1 kU/L
Ragweed, Short IgE: 2.4 kU/L — AB
Sheep Sorrel IgE Qn: <0.1 kU/L
Timothy Grass IgE: 6.41 kU/L — AB
White Mulberry IgE: <0.1 kU/L

## 2020-12-04 LAB — ALLERGEN, TURKEY, F284: Allergen Turkey IgE: <0.1 kU/L

## 2020-12-04 LAB — ALLERGEN,OAT,F7: Allergen Oat IgE: <0.1 kU/L

## 2020-12-04 LAB — SPECIMEN STATUS REPORT

## 2020-12-04 LAB — IGE NUT PROF. W/COMPONENT RFLX
F017-IgE Hazelnut (Filbert): <0.1 kU/L
F018-IgE Brazil Nut: <0.1 kU/L
F020-IgE Almond: <0.1 kU/L
F202-IgE Cashew Nut: <0.1 kU/L
F203-IgE Pistachio Nut: <0.1 kU/L
F256-IgE Walnut: <0.1 kU/L
Macadamia Nut, IgE: <0.1 kU/L
Peanut, IgE: <0.1 kU/L
Pecan Nut IgE: <0.1 kU/L

## 2020-12-04 LAB — ALLERGEN COCONUT IGE: Allergen Coconut IgE: <0.1 kU/L

## 2020-12-04 LAB — ALLERGEN, KIDNEY BEAN, RF287: Kidney Bean IgE: <0.1 kU/L

## 2020-12-04 LAB — ALLERGEN SESAME F10: Sesame Seed IgE: <0.1 kU/L

## 2020-12-04 LAB — TRYPTASE: Tryptase: 4.6 ug/L (ref 2.2–13.2)

## 2020-12-04 LAB — EGG COMPONENT PANEL
F232-IgE Ovalbumin: <0.1 kU/L
F233-IgE Ovomucoid: <0.1 kU/L

## 2020-12-04 LAB — ALLERGEN, WHEAT, F4: Wheat IgE: 0.24 kU/L — AB

## 2020-12-04 LAB — ALLERGEN, SALMON, F41: Allergen Salmon IgE: <0.1 kU/L

## 2020-12-04 LAB — ALLERGEN SOYBEAN: Soybean IgE: <0.1 kU/L

## 2020-12-04 LAB — ALLERGEN, BEAN BLACK
Black Bean*, IgE: <0.35 kU/L (ref <0.35)
Class Interpretation: 0

## 2020-12-04 LAB — ALLERGEN PEA F12: Allergen Green Pea IgE: <0.1 kU/L

## 2020-12-04 LAB — ALLERGEN SUNFLOWER SEED K84: Sunflower Seed k84: <0.1 kU/L

## 2020-12-04 LAB — ALLERGEN, CORN F8: Allergen Corn, IgE: <0.1 kU/L

## 2020-12-04 LAB — ALLERGEN, CHICKEN F83: Chicken IgE: <0.1 kU/L

## 2020-12-04 LAB — ALLERGEN, CHICK PEA, RF309, IGE: F309-IgE Chick Pea: <0.1 kU/L

## 2020-12-04 LAB — ALLERGEN, PUMPKIN (F225) IGE: Pumpkin IgE: <0.1 kU/L

## 2020-12-04 MED ORDER — TRIAMCINOLONE ACETONIDE 0.1 % EX OINT
1.0000 | TOPICAL_OINTMENT | Freq: Two times a day (BID) | CUTANEOUS | 2 refills | Status: DC | PRN
Start: 2020-12-04 — End: 2021-06-04

## 2020-12-04 NOTE — Addendum Note (Signed)
Addended by: Grier Rocher on: 12/04/2020 10:32 AM   Modules accepted: Orders

## 2020-12-11 ENCOUNTER — Other Ambulatory Visit: Payer: Self-pay

## 2020-12-11 ENCOUNTER — Encounter: Payer: Self-pay | Admitting: Internal Medicine

## 2020-12-11 ENCOUNTER — Ambulatory Visit: Payer: 59 | Admitting: Internal Medicine

## 2020-12-11 VITALS — BP 118/73 | HR 58 | Ht 70.5 in | Wt 195.5 lb

## 2020-12-11 DIAGNOSIS — M0600 Rheumatoid arthritis without rheumatoid factor, unspecified site: Secondary | ICD-10-CM | POA: Diagnosis not present

## 2020-12-11 DIAGNOSIS — J310 Chronic rhinitis: Secondary | ICD-10-CM

## 2020-12-11 NOTE — Patient Instructions (Signed)
  I do not recommend starting any additional rheumatoid arthritis medicines at this time due to not very severe inflammatory changes on exam, some improvement with diet modification, and previous intolerance of several RA medicines in the past. [place]la NSAIDs such as meloxicam are reasonable to take with no evidence of joint erosion from the RA or very severe disease. We can certainly take another look if needed for change in symptoms about other treatment options.
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
## 2020-12-12 LAB — SEDIMENTATION RATE: Sed Rate: 2 mm/h (ref 0–15)

## 2020-12-12 LAB — C-REACTIVE PROTEIN: CRP: 0.3 mg/L (ref <8.0)

## 2020-12-13 NOTE — Progress Notes (Signed)
Blood test markers for systemic inflammation are normal, based on this and our visit I do not think starting another rheumatoid arthritis medication is needed right now we can follow up as planned.

## 2020-12-23 NOTE — Progress Notes (Signed)
[name] [address] [place] [address] Phone: [phone] Subjective:   Darrell Jordan, am serving as a scribe for Dr. [name].  I'm seeing this patient by the request  of:  [name], [name], MD  CC: Back pain and neck pain follow-up  [id number]  Darrell Jordan is a 46 y.o. male coming in with complaint of back and neck pain. OMT 11/03/2020. Patient states that his neck is good, back is generally fine, but his left shoulder is bothering him. Is able to do a push up, but unable to do a pull up or lay on his back with his left arm up.  Patient states otherwise doing relatively well  Medications patient has been prescribed: Meloxicam tizanidine  Taking: Meloxicam Tizanidine          Reviewed prior external information including notes and imaging from previsou exam, outside providers and external EMR if available.   As well as notes that were available from care everywhere and other healthcare systems.  Past medical history, social, surgical and family history all reviewed in electronic medical record.  No pertanent information unless stated regarding to the chief complaint.   Past Medical History:  Diagnosis Date   Mild intermittent asthma, uncomplicated 11/28/2020   Rheumatoid arthritis (HCC)     Allergies  Allergen Reactions   Corn-Containing Products    Eggs Or Egg-Derived Products    Gabapentin    Peanut-Containing Drug Products    Soy Allergy    Sulfasalazine    Wheat Bran      Review of Systems:  No headache, visual changes, nausea, vomiting, diarrhea, constipation, dizziness, abdominal pain, skin rash, fevers, chills, night sweats, weight loss, swollen lymph nodes, body aches, joint swelling, chest pain, shortness of breath, mood changes. POSITIVE muscle aches  Objective  Blood pressure 110/60, pulse (!) 59, height 5' 10.5" (1.791 m), weight 194 lb (88 kg), SpO2 97 %.   General: No apparent distress alert and  oriented x3 mood and affect normal, dressed appropriately.  HEENT: Pupils equal, extraocular movements intact  Respiratory: Patient's speak in full sentences and does not appear short of breath  Cardiovascular: No lower extremity edema, non tender, no erythema  Patient back exam still has some mild tightness noted.  Some mild limited sidebending bilaterally.  Patient does have tightness noted in the paraspinal musculature.  Patient Is actually made some mild improvement in the terms musculature of the back noted. Left shoulder exam does show the patient does have a positive crossover test with pain over the acromioclavicular joint.   Osteopathic findings   C7 flexed rotated and side bent left T3 extended rotated and side bent right inhaled rib T11 extended rotated and side bent left        Assessment and Plan:  Degenerative cervical disc Chronic problem and stable.  Discussed with patient that I do feel he is making some improvement.  Discussed icing regimen and home exercises.  Still responding well to osteopathic manipulation.  Follow-up with me again in 6 to 8 weeks  Left shoulder pain Left shoulder shows mild pain that seems to be more of acromioclavicular pain.  Worsening pain will consider injection at follow-up   Nonallopathic problems  Decision today to treat with OMT was based on Physical Exam  After verbal consent patient was treated with HVLA, ME, FPR techniques in cervical, rib, thoracic  areas  Patient tolerated the procedure well with improvement in symptoms  Patient given exercises,  stretches and lifestyle modifications  See medications in patient instructions if given  Patient will follow up in 4-8 weeks     The above documentation has been reviewed and is accurate and complete Judi Saa, DO        Note: This dictation was prepared with Dragon dictation along with smaller phrase technology. Any transcriptional errors that result from this  process are unintentional.

## 2020-12-24 ENCOUNTER — Encounter: Payer: Self-pay | Admitting: Family Medicine

## 2020-12-24 ENCOUNTER — Ambulatory Visit: Payer: 59 | Admitting: Family Medicine

## 2020-12-24 ENCOUNTER — Other Ambulatory Visit: Payer: Self-pay

## 2020-12-24 VITALS — BP 110/60 | HR 59 | Ht 70.5 in | Wt 194.0 lb

## 2020-12-24 DIAGNOSIS — M25512 Pain in left shoulder: Secondary | ICD-10-CM | POA: Diagnosis not present

## 2020-12-24 DIAGNOSIS — M9902 Segmental and somatic dysfunction of thoracic region: Secondary | ICD-10-CM | POA: Diagnosis not present

## 2020-12-24 DIAGNOSIS — M9901 Segmental and somatic dysfunction of cervical region: Secondary | ICD-10-CM | POA: Diagnosis not present

## 2020-12-24 DIAGNOSIS — M9908 Segmental and somatic dysfunction of rib cage: Secondary | ICD-10-CM | POA: Diagnosis not present

## 2020-12-24 DIAGNOSIS — M5030 Other cervical disc degeneration, unspecified cervical region: Secondary | ICD-10-CM | POA: Diagnosis not present

## 2020-12-24 NOTE — Assessment & Plan Note (Signed)
Chronic problem and stable.  Discussed with patient that I do feel he is making some improvement.  Discussed icing regimen and home exercises.  Still responding well to osteopathic manipulation.  Follow-up with me again in 6 to 8 weeks

## 2020-12-24 NOTE — Patient Instructions (Addendum)
Good to see you Keep up the progress you are making See me again in 6-8 weeks

## 2020-12-24 NOTE — Assessment & Plan Note (Signed)
Left shoulder shows mild pain that seems to be more of acromioclavicular pain.  Worsening pain will consider injection at follow-up

## 2020-12-29 ENCOUNTER — Other Ambulatory Visit: Payer: Self-pay

## 2020-12-29 MED ORDER — MELOXICAM 7.5 MG PO TABS: 7.5000 mg | ORAL_TABLET | Freq: Every day | ORAL | 0 refills | Status: DC

## 2020-12-31 ENCOUNTER — Encounter: Payer: Self-pay | Admitting: Family Medicine

## 2020-12-31 ENCOUNTER — Other Ambulatory Visit: Payer: Self-pay | Admitting: Family Medicine

## 2020-12-31 MED ORDER — TIZANIDINE HCL 2 MG PO TABS
2.0000 mg | ORAL_TABLET | Freq: Every day | ORAL | 0 refills | Status: DC
Start: 2020-12-31 — End: 2021-01-28

## 2021-01-27 ENCOUNTER — Encounter: Payer: Self-pay | Admitting: Family Medicine

## 2021-01-28 ENCOUNTER — Other Ambulatory Visit: Payer: Self-pay | Admitting: Family Medicine

## 2021-01-28 MED ORDER — TIZANIDINE HCL 2 MG PO TABS: 2.0000 mg | ORAL_TABLET | Freq: Every day | ORAL | 0 refills | Status: DC

## 2021-01-28 MED ORDER — MELOXICAM 7.5 MG PO TABS: 7.5000 mg | ORAL_TABLET | Freq: Every day | ORAL | 0 refills | Status: DC

## 2021-01-29 MED ORDER — TRAZODONE HCL 50 MG PO TABS: 50.0000 mg | ORAL_TABLET | Freq: Every day | ORAL | 3 refills | Status: DC

## 2021-02-09 NOTE — Progress Notes (Signed)
[name] [address] [place] [address] Phone: [phone] Subjective:   Darrell Jordan, am serving as a scribe for Dr. [name].  I'm seeing this patient by the request  of:  [name], [name], MD  CC: Neck pain follow-up and new onset low back and hip pain  [id number]  Darrell Jordan is a 45 y.o. male coming in with complaint of back and neck pain. OMT 12/24/2020. Patient states that his neck is doing better, did have a issue a few weeks ago but with ice and muscle relaxer's was able to work through it. Patient states that he has been having lower back/hip pain for the last couple weeks. Patient can relieve the pain with stretching, but the hip pain is new for him   Medications patient has been prescribed: Meloxicam, Zanaflex  Taking: Meloxicam Zanaflex          Past Medical History:  Diagnosis Date   Mild intermittent asthma, uncomplicated 11/28/2020   Rheumatoid arthritis (HCC)     Allergies  Allergen Reactions   Corn-Containing Products    Eggs Or Egg-Derived Products    Gabapentin    Peanut-Containing Drug Products    Soy Allergy    Sulfasalazine    Wheat Bran      Review of Systems:  No headache, visual changes, nausea, vomiting, diarrhea, constipation, dizziness, abdominal pain, skin rash, fevers, chills, night sweats, weight loss, swollen lymph nodes, chest pain, shortness of breath, mood changes. POSITIVE muscle aches, body aches, joint swelling  Objective  Blood pressure 100/70, pulse 67, height 5' 10.5" (1.791 m), weight 193 lb (87.5 kg), SpO2 98 %.   General: No apparent distress alert and oriented x3 mood and affect normal, dressed appropriately.  HEENT: Pupils equal, extraocular movements intact  Respiratory: Patient's speak in full sentences and does not appear short of breath  Cardiovascular: No lower extremity edema, non tender, no erythema  Patient does have significant more tightness noted in the  low back.  Patient also has what appears to be some mild swelling of the MCP joints of the second and third fingers.  Patient does have limited range of motion in certain planes at the moment.  Tightness noted in the piriformis area bilaterally.  He does have tightness of the hips more than usual.  Limited range of motion of the last 5 degrees of external and internal range of motion bilaterally.  Tightness with straight leg test bilaterally.   Osteopathic findings C2 flexed rotated and side bent right C7 flexed rotated and side bent left T3 extended rotated and side bent right inhaled rib T9 extended rotated and side bent left L2 flexed rotated and side bent right Sacrum right on right       Assessment and Plan:  Degenerative cervical disc Degenerative disc disease.  The patient does still have the potential for the seronegative rheumatoid arthritis.  Could be potentially having a potential flare with the tightness of muscle musculature.  Given prednisone to have on hand.  We will do the next 5 days.  Discussed icing regimen and home exercises.  Follow-up with me again 4 to 8 weeks  Piriformis syndrome of both sides Seems to be tightness.  Patient does have muscle relaxer and encouraged him to take it at night.  Prednisone.  Given home exercises and work with Event organiser.  Increase activity as tolerated.  Follow-up again in 6 to 8 weeks worsening pain will need advanced imaging and consider  formal physical therapy   Nonallopathic problems  Decision today to treat with OMT was based on Physical Exam  After verbal consent patient was treated with HVLA, ME, FPR techniques in cervical, rib, thoracic, lumbar, and sacral  areas  Patient tolerated the procedure well with improvement in symptoms  Patient given exercises, stretches and lifestyle modifications  See medications in patient instructions if given  Patient will follow up in 4-8 weeks      The above documentation has  been reviewed and is accurate and complete Judi Saa, DO       Note: This dictation was prepared with Dragon dictation along with smaller phrase technology. Any transcriptional errors that result from this process are unintentional.

## 2021-02-10 ENCOUNTER — Ambulatory Visit: Payer: 59 | Admitting: Family Medicine

## 2021-02-10 ENCOUNTER — Other Ambulatory Visit: Payer: Self-pay

## 2021-02-10 ENCOUNTER — Encounter: Payer: Self-pay | Admitting: Family Medicine

## 2021-02-10 VITALS — BP 100/70 | HR 67 | Ht 70.5 in | Wt 193.0 lb

## 2021-02-10 DIAGNOSIS — M9901 Segmental and somatic dysfunction of cervical region: Secondary | ICD-10-CM | POA: Diagnosis not present

## 2021-02-10 DIAGNOSIS — M9908 Segmental and somatic dysfunction of rib cage: Secondary | ICD-10-CM | POA: Diagnosis not present

## 2021-02-10 DIAGNOSIS — G5703 Lesion of sciatic nerve, bilateral lower limbs: Secondary | ICD-10-CM | POA: Diagnosis not present

## 2021-02-10 DIAGNOSIS — M5030 Other cervical disc degeneration, unspecified cervical region: Secondary | ICD-10-CM | POA: Diagnosis not present

## 2021-02-10 DIAGNOSIS — M9903 Segmental and somatic dysfunction of lumbar region: Secondary | ICD-10-CM

## 2021-02-10 DIAGNOSIS — M9902 Segmental and somatic dysfunction of thoracic region: Secondary | ICD-10-CM

## 2021-02-10 DIAGNOSIS — M9904 Segmental and somatic dysfunction of sacral region: Secondary | ICD-10-CM | POA: Diagnosis not present

## 2021-02-10 MED ORDER — PREDNISONE 20 MG PO TABS: 20.0000 mg | ORAL_TABLET | Freq: Two times a day (BID) | ORAL | 0 refills | Status: DC

## 2021-02-10 NOTE — Assessment & Plan Note (Signed)
Seems to be tightness.  Patient does have muscle relaxer and encouraged him to take it at night.  Prednisone.  Given home exercises and work with Event organiser.  Increase activity as tolerated.  Follow-up again in 6 to 8 weeks worsening pain will need advanced imaging and consider formal physical therapy

## 2021-02-10 NOTE — Patient Instructions (Addendum)
Good to see you  If things worsen send a message  Prednisone 40mg  daily for five days start tomorrow Exercises given See me again in 4-6 weeks

## 2021-02-10 NOTE — Assessment & Plan Note (Signed)
Degenerative disc disease.  The patient does still have the potential for the seronegative rheumatoid arthritis.  Could be potentially having a potential flare with the tightness of muscle musculature.  Given prednisone to have on hand.  We will do the next 5 days.  Discussed icing regimen and home exercises.  Follow-up with me again 4 to 8 weeks

## 2021-02-13 ENCOUNTER — Encounter: Payer: Self-pay | Admitting: Family Medicine

## 2021-02-26 ENCOUNTER — Other Ambulatory Visit: Payer: Self-pay | Admitting: Family Medicine

## 2021-03-09 NOTE — Progress Notes (Signed)
[name] [address] [place] [address] Phone: [phone] Subjective:   Darrell Jordan, am serving as a scribe for Dr. [name]. This visit occurred during the SARS-CoV-2 public health emergency.  Safety protocols were in place, including screening questions prior to the visit, additional usage of staff PPE, and extensive cleaning of exam room while observing appropriate contact time as indicated for disinfecting solutions.   I'm seeing this patient by the request  of:  [name], [name], MD  CC: Back and neck pain follow-up  [id number]  Darrell Jordan is a 45 y.o. male coming in with complaint of back and neck pain. OMT 02/10/2021. Patient states he's doing well. Still has the same pains in neck and back. No new complaints states that overall has been doing relatively well.  When he does a lot of activity he can have difficulty.  He was doing child's pose and when he was doing that and had some mild increase in discomfort.  Felt like he had more of a translation of the back at that time.  Medications patient has been prescribed: Zanaflex, Meloxicam  Taking:         Past Medical History:  Diagnosis Date   Mild intermittent asthma, uncomplicated 11/28/2020   Rheumatoid arthritis (HCC)     Allergies  Allergen Reactions   Corn-Containing Products    Eggs Or Egg-Derived Products    Gabapentin    Peanut-Containing Drug Products    Soy Allergy    Sulfasalazine    Wheat Bran      Review of Systems:  No headache, visual changes, nausea, vomiting, diarrhea, constipation, dizziness, abdominal pain, skin rash, fevers, chills, night sweats, weight loss, swollen lymph nodes, body aches, joint swelling, chest pain, shortness of breath, mood changes. POSITIVE muscle aches  Objective  Blood pressure 114/72, pulse 61, height 5\' 10"  (1.778 m), weight 197 lb (89.4 kg), SpO2 97 %.   General: No apparent distress alert and oriented x3 mood and  affect normal, dressed appropriately.  HEENT: Pupils equal, extraocular movements intact  Respiratory: Patient's speak in full sentences and does not appear short of breath  Cardiovascular: No lower extremity edema, non tender, no erythema  Neck exam does have some mild loss of lordosis and some tenderness to palpation.  Patient does have tightness noted in the parascapular region as well.  Seems to be more right greater than left but also some more on the left than usual.  Osteopathic findings  C2 flexed rotated and side bent right C6 flexed rotated and side bent left T3 extended rotated and side bent right inhaled rib T7 extended rotated and side bent left with inhaled rib       Assessment and Plan:  Degenerative cervical disc DDD noted discussed HEP, overall has made some improvement.  Discussed with patient about icing regimen and home exercises.  Patient is to increase activity slowly still.  Patient has been able to do things such as motorcycling which he was not doing previously.  Does have the Zanaflex for breakthrough and does have the meloxicam.  Patient is following up with his rheumatologist as well for this chronic problem with mild intermittent exacerbations.  Follow-up with me again in 6 to 8 weeks   Nonallopathic problems  Decision today to treat with OMT was based on Physical Exam  After verbal consent patient was treated with HVLA, ME, FPR techniques in cervical, rib, thoracic areas  Patient tolerated the procedure well with  improvement in symptoms  Patient given exercises, stretches and lifestyle modifications  See medications in patient instructions if given  Patient will follow up in 4-8 weeks      The above documentation has been reviewed and is accurate and complete Judi Saa, DO       Note: This dictation was prepared with Dragon dictation along with smaller phrase technology. Any transcriptional errors that result from this process are  unintentional.

## 2021-03-10 ENCOUNTER — Other Ambulatory Visit: Payer: Self-pay

## 2021-03-10 ENCOUNTER — Encounter: Payer: Self-pay | Admitting: Family Medicine

## 2021-03-10 ENCOUNTER — Ambulatory Visit: Payer: 59 | Admitting: Family Medicine

## 2021-03-10 VITALS — BP 114/72 | HR 61 | Ht 70.0 in | Wt 197.0 lb

## 2021-03-10 DIAGNOSIS — M5030 Other cervical disc degeneration, unspecified cervical region: Secondary | ICD-10-CM | POA: Diagnosis not present

## 2021-03-10 DIAGNOSIS — M9902 Segmental and somatic dysfunction of thoracic region: Secondary | ICD-10-CM | POA: Diagnosis not present

## 2021-03-10 DIAGNOSIS — M9901 Segmental and somatic dysfunction of cervical region: Secondary | ICD-10-CM

## 2021-03-10 DIAGNOSIS — M9908 Segmental and somatic dysfunction of rib cage: Secondary | ICD-10-CM | POA: Diagnosis not present

## 2021-03-10 NOTE — Assessment & Plan Note (Signed)
DDD noted discussed HEP, overall has made some improvement.  Discussed with patient about icing regimen and home exercises.  Patient is to increase activity slowly still.  Patient has been able to do things such as motorcycling which he was not doing previously.  Does have the Zanaflex for breakthrough and does have the meloxicam.  Patient is following up with his rheumatologist as well for this chronic problem with mild intermittent exacerbations.  Follow-up with me again in 6 to 8 weeks

## 2021-03-10 NOTE — Patient Instructions (Signed)
Great to see you  Stay active  Gustavus Bryant is your friend Don't win yoga  See me again in 6-8 weeks

## 2021-03-11 NOTE — Progress Notes (Addendum)
Office Visit Note  Patient: Darrell Jordan             Date of Birth: [birth year]           MRN: [id number]             PCP: Caren Macadam, MD Referring: Caren Macadam, MD Visit Date: 03/12/2021   Subjective:  Follow-up (Patient has noticed an increase in pain over the Summer. Patient sees Dr. Tamala Julian regularly and completed a course of steroids with improvement. Patient complains of continued generalized joint pain and muscle tightness. )   History of Present Illness: Darrell Jordan is a 45 y.o. male here for follow up for a seronegative rheumatoid arthritis treating conservatively with oral meloxicam. He has continued follow up with Dr. Tamala Julian for somatic dysfunction and took a short treatment with prednisone over the interval.  The most persistent symptom of all is a generalized severe muscle tightness and stiffness.  During the increased pain a few weeks ago he was also feeling worsening generalized symptoms such as brain fog and concentration difficulty.  Since finishing the most recent steroid treatment about 3 weeks ago he felt this has greatly improved again.  Some MCP joint swelling was described in outside clinic since our last visit but he reports it is currently improved.  Previous HPI 12/11/20 Darrell Jordan is a 45 y.o. male here for evaluation and management of seronegative rheumatoid arthritis. Recent visit with Dr. Tamala Julian noted ongoing joint pains and evidence of ongoing joint inflammation on exam. His rheumatoid arthritis was originally diagnosed in [facility] 2016 after onset of numerous inflammatory problems with severe allergies, GI intolerance, and body pains. That started after an apparent fall onto head with no severe neck injury on xray or MRI. He tried several medications including HCQ did nothing for symptoms, SSZ caused excessive GI symptoms. He was a previous patient at [facility] rheumatology with previous treatment on methotrexate with Dr. Trudie Reddix that he tolerated for a  while but felt very nauseated and drained after each dose. Review of documentation also suggests alternative causes for arthralgia including possible foods including gluten intolerance. He has felt a lot worse in the past month or two with fairly generalized pain increase and also allergic symptoms. He tried cutting all wheat from diet about 1.5 weeks ago and thinks this might be helping but pretty early to say. Symptoms are worst in his upper extremities, neck, and back and feels like his joints or muscles are too tight all the time.   Labs reviewed 08/2020 ANA neg RF neg CCP neg ESR 5 Uric acid 6.9 PTH wnl Testosterone wnl TSH wnl CBC Plts 140   Review of Systems  Constitutional:  Negative for fatigue.  HENT:  Negative for mouth sores, mouth dryness and nose dryness.   Eyes:  Positive for visual disturbance. Negative for pain, itching and dryness.  Respiratory:  Negative for cough, hemoptysis, shortness of breath and difficulty breathing.   Cardiovascular:  Negative for chest pain, palpitations and swelling in legs/feet.  Gastrointestinal:  Negative for abdominal pain, blood in stool, constipation and diarrhea.  Endocrine: Negative for increased urination.  Genitourinary:  Negative for painful urination.  Musculoskeletal:  Positive for joint pain, joint pain, joint swelling, myalgias, muscle weakness, morning stiffness, muscle tenderness and myalgias.  Skin:  Negative for color change, rash and redness.  Allergic/Immunologic: Negative for susceptible to infections.  Neurological:  Positive for headaches. Negative for dizziness, numbness, memory loss and weakness.  Hematological:  Negative  for swollen glands.  Psychiatric/Behavioral:  Negative for confusion and sleep disturbance.    PMFS History:  Patient Active Problem List   Diagnosis Date Noted   Pain in right hip 03/12/2021   Piriformis syndrome of both sides 02/10/2021   Left shoulder pain 12/24/2020   Dyshidrotic eczema  11/28/2020   Chronic rhinitis 11/28/2020   Mild intermittent asthma, uncomplicated 93/79/0240   Anaphylactic shock due to adverse food reaction 11/28/2020   Degenerative cervical disc 03/25/2020   Nonallopathic lesion of cervical region 03/25/2020   Nonallopathic lesion of thoracic region 03/25/2020   Nonallopathic lesion of rib cage 03/25/2020   Seronegative rheumatoid arthritis ([place]) 03/25/2020   Adjustment insomnia 03/20/2018   Recurrent major depressive disorder, in full remission ([place]) 03/20/2018   Depressive disorder 07/24/2017   Hyperinsulinism 07/24/2017   Nausea 07/24/2017    Past Medical History:  Diagnosis Date   Mild intermittent asthma, uncomplicated 03/04/3531   Rheumatoid arthritis ([place])     Family History  Problem Relation Age of Onset   Hypotension Mother    Atrial fibrillation Mother    Other Father        joint pain, undiagnosed   Prostate cancer Father 77   Bone cancer Father        mets from prostate cancer   Rheum arthritis Brother 49   Heart disease Maternal Grandmother        valve replacement   Heart attack Maternal Grandfather 40   Heart disease Paternal Grandmother    Alcohol abuse Paternal Grandfather    Emphysema Paternal Grandfather    Lupus Paternal Aunt    Past Surgical History:  Procedure Laterality Date   VASECTOMY  2010   Social History   Social History Narrative   Not on file   Immunization History  Administered Date(s) Administered   Influenza Split 03/21/2017   Influenza,inj,Quad PF,6+ Mos 03/21/2017   Influenza,inj,Quad PF,6-35 Mos 03/20/2018, 06/12/2019, 04/24/2020   Janssen (J&J) SARS-COV-2 Vaccination 11/01/2019   PFIZER(Purple Top)SARS-COV-2 Vaccination 05/30/2020   Tdap 12/03/2015     Objective: Vital Signs: BP (!) 142/84 (BP Location: Left Arm, Patient Position: Sitting, Cuff Size: Normal)   Pulse 71   Ht 5' 11"  (1.803 m)   Wt 193 lb 9.6 oz (87.8 kg)   BMI 27.00 kg/m    Physical Exam Eyes:      Conjunctiva/sclera: Conjunctivae normal.  Cardiovascular:     Rate and Rhythm: Normal rate and regular rhythm.  Pulmonary:     Effort: Pulmonary effort is normal.     Breath sounds: Normal breath sounds.  Skin:    General: Skin is warm and dry.  Neurological:     General: No focal deficit present.     Mental Status: He is alert.     Deep Tendon Reflexes: Reflexes normal.  Psychiatric:        Mood and Affect: Mood normal.     Musculoskeletal Exam:  Neck full ROM no tenderness Shoulders full ROM no tenderness or swelling Elbows full ROM no tenderness or swelling Wrists full ROM no tenderness or swelling Fingers full ROM no tenderness or swelling Right hip pain radiating to the groin with pace maneuver and internal rotation, posterior pain with FABER and hip breast, left hip without significant tenderness Knees full ROM no tenderness or swelling    Investigation: No additional findings.  Imaging: XR HIP UNILAT W OR W/O PELVIS 2-3 VIEWS RIGHT  Result Date: 03/12/2021 X-ray right hip and pelvis 3 views Normal appearance of  bilateral femoral acetabular joints.  Visualized portion of the lumbar spine appears normal.  There is mild subchondral sclerosis in the bilateral SI joints with no narrowing or erosions are seen.  No abnormal bone mineralization. Impression No significant femoral acetabular joint arthritis.  Mild degenerative SI joint changes no signs of inflammatory disease changes   Recent Labs: Lab Results  Component Value Date   WBC 8.1 09/24/2020   HGB 15.3 09/24/2020   PLT 140.0 (L) 09/24/2020   CALCIUM 9.8 09/24/2020    Speciality Comments: No specialty comments available.  Procedures:  No procedures performed Allergies: Corn-containing products, Eggs or egg-derived products, Gabapentin, Peanut-containing drug products, Soy allergy, Sulfasalazine, and Wheat bran   Assessment / Plan:     Visit Diagnoses: Seronegative rheumatoid arthritis ([place]) - Plan:  Sedimentation rate, C-reactive protein, CK  No specific inflammatory changes seen again on exam today.  His symptoms remain not highly responsive to NSAIDs but do improve greatly on the intermittent prednisone treatments.  Checking hip and pelvis x-rays today since symptoms and labs could be consistent with a seronegative arthritis.  However does not describe significant low back pain at this time.  Piriformis syndrome of both sides  Pain in right hip - Plan: XR HIP UNILAT W OR W/O PELVIS 2-3 VIEWS RIGHT, Sedimentation rate, C-reactive protein, CK  Hip pain some is posterior consistent with the piriformis syndrome or lateral hip pain syndrome but also has some pain radiating to the groin region provoked with internal rotation so checking plain hip x-ray today.  Orders: Orders Placed This Encounter  Procedures   XR HIP UNILAT W OR W/O PELVIS 2-3 VIEWS RIGHT   Sedimentation rate   C-reactive protein   CK    No orders of the defined types were placed in this encounter.    Follow-Up Instructions: Return in about 6 months (around 09/09/2021) for Seronegative arthritis on NSAID/OMT f/u 42mo.   CCollier Salina MD  Note - This record has been created using [employer]  Chart creation errors have been sought, but may not always  have been located. Such creation errors do not reflect on  the standard of medical care.

## 2021-03-12 ENCOUNTER — Ambulatory Visit: Payer: Self-pay

## 2021-03-12 ENCOUNTER — Ambulatory Visit: Payer: 59 | Admitting: Internal Medicine

## 2021-03-12 ENCOUNTER — Encounter: Payer: Self-pay | Admitting: Internal Medicine

## 2021-03-12 ENCOUNTER — Other Ambulatory Visit: Payer: Self-pay

## 2021-03-12 VITALS — BP 142/84 | HR 71 | Ht 71.0 in | Wt 193.6 lb

## 2021-03-12 DIAGNOSIS — G5703 Lesion of sciatic nerve, bilateral lower limbs: Secondary | ICD-10-CM

## 2021-03-12 DIAGNOSIS — M25551 Pain in right hip: Secondary | ICD-10-CM | POA: Diagnosis not present

## 2021-03-12 DIAGNOSIS — M0600 Rheumatoid arthritis without rheumatoid factor, unspecified site: Secondary | ICD-10-CM | POA: Diagnosis not present

## 2021-03-12 NOTE — Patient Instructions (Addendum)
Your symptoms sound suggestive for myofascial pain with the chronic muscle tightness and spasticity. I am also repeated checking markers for evidence of autoimmune disease activity but don't see much specifically on exam today. Issues with concentration and brain fog can develop associated with chronic arthritis problems this is sometimes due to what would be called fibromyalgia or a central pain sensitization.  I recommend checking out the [facility] [place] patient-centered guide for fibromyalgia and chronic pain management: https://howell-gardner.net/  Myofascial Pain Syndrome and Fibromyalgia Myofascial pain syndrome and fibromyalgia are both pain disorders. This pain may be felt mainly in your muscles. Myofascial pain syndrome: Always has tender points in the muscle that will cause pain when pressed (trigger points). The pain may come and go. Usually affects your neck, upper back, and shoulder areas. The pain often radiates into your arms and hands. Fibromyalgia: Has muscle pains and tenderness that come and go. Is often associated with fatigue and sleep problems. Has trigger points. Tends to be long-lasting (chronic), but is not life-threatening. Fibromyalgia and myofascial pain syndrome are not the same. However, they often occur together. If you have both conditions, each can make the other worse. Both are common and can cause enough pain and fatigue to make day-to-day activities difficult. Both can be hard to diagnose because their symptoms are common in many other conditions. What are the causes? The exact causes of these conditions are not known. What increases the risk? You are more likely to develop this condition if: You have a family history of the condition. You have certain triggers, such as: Spine disorders. An injury (trauma) or other physical stressors. Being under a lot of stress. Medical conditions such as osteoarthritis, rheumatoid arthritis, or lupus. What are the  signs or symptoms? Fibromyalgia The main symptom of fibromyalgia is widespread pain and tenderness in your muscles. Pain is sometimes described as stabbing, shooting, or burning. You may also have: Tingling or numbness. Sleep problems and fatigue. Problems with attention and concentration (fibro fog). Other symptoms may include:  Bowel and bladder problems. Headaches. Visual problems. Problems with odors and noises. Depression or mood changes. Painful menstrual periods (dysmenorrhea). Dry skin or eyes. These symptoms can vary over time. Myofascial pain syndrome Symptoms of myofascial pain syndrome include: Tight, ropy bands of muscle. Uncomfortable sensations in muscle areas. These may include aching, cramping, burning, numbness, tingling, and weakness. Difficulty moving certain parts of the body freely (poor range of motion). How is this diagnosed? This condition may be diagnosed by your symptoms and medical history. You will also have a physical exam. In general: Fibromyalgia is diagnosed if you have pain, fatigue, and other symptoms for more than 3 months, and symptoms cannot be explained by another condition. Myofascial pain syndrome is diagnosed if you have trigger points in your muscles, and those trigger points are tender and cause pain elsewhere in your body (referred pain). How is this treated? Treatment for these conditions depends on the type that you have. For fibromyalgia: Pain medicines, such as NSAIDs. Medicines for treating depression. Medicines for treating seizures. Medicines that relax the muscles. For myofascial pain: Pain medicines, such as NSAIDs. Cooling and stretching of muscles. Trigger point injections. Sound wave (ultrasound) treatments to stimulate muscles. Treating these conditions often requires a team of health care providers. These may include: Your primary care provider. Physical therapist. Complementary health care providers, such as massage  therapists or acupuncturists. Psychiatrist for cognitive behavioral therapy. Follow these instructions at home: Medicines Take over-the-counter and prescription medicines  only as told by your health care provider. Do not drive or use heavy machinery while taking prescription pain medicine. If you are taking prescription pain medicine, take actions to prevent or treat constipation. Your health care provider may recommend that you: Drink enough fluid to keep your urine pale yellow. Eat foods that are high in fiber, such as fresh fruits and vegetables, whole grains, and beans. Limit foods that are high in fat and processed sugars, such as fried or sweet foods. Take an over-the-counter or prescription medicine for constipation. Lifestyle  Exercise as directed by your health care provider or physical therapist. Practice relaxation techniques to control your stress. You may want to try: Biofeedback. Visual imagery. Hypnosis. Muscle relaxation. Yoga. Meditation. Maintain a healthy lifestyle. This includes eating a healthy diet and getting enough sleep. Do not use any products that contain nicotine or tobacco, such as cigarettes and e-cigarettes. If you need help quitting, ask your health care provider. General instructions Talk to your health care provider about complementary treatments, such as acupuncture or massage. Consider joining a support group with others who are diagnosed with this condition. Do not do activities that stress or strain your muscles. This includes repetitive motions and heavy lifting. Keep all follow-up visits as told by your health care provider. This is important. Where to find more information National Fibromyalgia Association: www.fmaware.org Arthritis Foundation: www.arthritis.org American Chronic Pain Association: www.theacpa.org Contact a health care provider if: You have new symptoms. Your symptoms get worse or your pain is severe. You have side effects  from your medicines. You have trouble sleeping. Your condition is causing depression or anxiety. Summary Myofascial pain syndrome and fibromyalgia are pain disorders. Myofascial pain syndrome has tender points in the muscle that will cause pain when pressed (trigger points). Fibromyalgia also has muscle pains and tenderness that come and go, but this condition is often associated with fatigue and sleep disturbances. Fibromyalgia and myofascial pain syndrome are not the same but often occur together, causing pain and fatigue that make day-to-day activities difficult. Treatment for fibromyalgia includes taking medicines to relax the muscles and medicines for pain, depression, or seizures. Treatment for myofascial pain syndrome includes taking medicines for pain, cooling and stretching of muscles, and injecting medicines into trigger points. Follow your health care provider's instructions for taking medicines and maintaining a healthy lifestyle.

## 2021-03-13 LAB — CK: Total CK: 110 U/L (ref 44–196)

## 2021-03-13 LAB — C-REACTIVE PROTEIN: CRP: 0.6 mg/L (ref <8.0)

## 2021-03-13 LAB — SEDIMENTATION RATE: Sed Rate: 2 mm/h (ref 0–15)

## 2021-03-15 NOTE — Progress Notes (Signed)
Lab markers for inflammation are all normal.  I don't see any need to start additional mediations or testing at this time.

## 2021-03-22 ENCOUNTER — Other Ambulatory Visit: Payer: Self-pay | Admitting: Family Medicine

## 2021-03-22 MED ORDER — TIZANIDINE HCL 2 MG PO TABS: 2.0000 mg | ORAL_TABLET | Freq: Every day | ORAL | 0 refills | Status: DC

## 2021-04-19 ENCOUNTER — Encounter: Payer: Self-pay | Admitting: Family Medicine

## 2021-04-19 DIAGNOSIS — Z1211 Encounter for screening for malignant neoplasm of colon: Secondary | ICD-10-CM

## 2021-04-24 ENCOUNTER — Encounter (HOSPITAL_COMMUNITY): Payer: Self-pay

## 2021-04-24 ENCOUNTER — Emergency Department (HOSPITAL_COMMUNITY): Payer: 59

## 2021-04-24 ENCOUNTER — Other Ambulatory Visit: Payer: Self-pay

## 2021-04-24 ENCOUNTER — Emergency Department (HOSPITAL_COMMUNITY)
Admission: EM | Admit: 2021-04-24 | Discharge: 2021-04-24 | Disposition: A | Discharge status: Home / Self Care | Payer: 59 | Attending: Emergency Medicine | Admitting: Emergency Medicine

## 2021-04-24 DIAGNOSIS — S91331A Puncture wound without foreign body, right foot, initial encounter: Secondary | ICD-10-CM | POA: Diagnosis not present

## 2021-04-24 DIAGNOSIS — Z87891 Personal history of nicotine dependence: Secondary | ICD-10-CM | POA: Diagnosis not present

## 2021-04-24 DIAGNOSIS — J45909 Unspecified asthma, uncomplicated: Secondary | ICD-10-CM | POA: Insufficient documentation

## 2021-04-24 DIAGNOSIS — S99921A Unspecified injury of right foot, initial encounter: Secondary | ICD-10-CM | POA: Diagnosis present

## 2021-04-24 DIAGNOSIS — W293XXA Contact with powered garden and outdoor hand tools and machinery, initial encounter: Secondary | ICD-10-CM | POA: Insufficient documentation

## 2021-04-24 DIAGNOSIS — Z91010 Allergy to peanuts: Secondary | ICD-10-CM | POA: Diagnosis not present

## 2021-04-24 DIAGNOSIS — Z23 Encounter for immunization: Secondary | ICD-10-CM | POA: Insufficient documentation

## 2021-04-24 DIAGNOSIS — T148XXA Other injury of unspecified body region, initial encounter: Secondary | ICD-10-CM

## 2021-04-24 MED ORDER — TETANUS-DIPHTH-ACELL PERTUSSIS 5-2.5-18.5 LF-MCG/0.5 IM SUSY
0.5000 mL | PREFILLED_SYRINGE | Freq: Once | INTRAMUSCULAR | Status: AC
  Administered 2021-04-24: 0.5 mL via INTRAMUSCULAR
  Filled 2021-04-24: qty 0.5

## 2021-04-24 MED ORDER — LEVOFLOXACIN 500 MG PO TABS: 500.0000 mg | ORAL_TABLET | Freq: Every day | ORAL | 0 refills | Status: DC

## 2021-04-24 NOTE — ED Provider Notes (Signed)
[facility]-EMERGENCY DEPT Provider Note   CSN: [id number] Arrival date & time: 04/24/21  1350     History Chief Complaint  Patient presents with   Foot Injury    Darrell Jordan is a 45 y.o. male.  HPI  45 year old male with a history of asthma, rheumatoid arthritis, who presents to the emergency department today for evaluation of a wound to the right foot.  He states that he was tilling pta and accidentally punctured his foot with it. He has some tingling to his toes. Denies any other injuries.   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   Degenerative cervical disc 03/25/2020   Nonallopathic lesion of cervical region 03/25/2020   Nonallopathic lesion of thoracic region 03/25/2020   Nonallopathic lesion of rib cage 03/25/2020   Seronegative rheumatoid arthritis (HCC) 03/25/2020   Adjustment insomnia 03/20/2018   Recurrent major depressive disorder, in full remission (HCC) 03/20/2018   Depressive disorder 07/24/2017   Hyperinsulinism 07/24/2017   Nausea 07/24/2017    Past Surgical History:  Procedure Laterality Date   VASECTOMY  2010       Family History  Problem Relation Age of Onset   Hypotension Mother    Atrial fibrillation Mother    Other Father        joint pain, undiagnosed   Prostate cancer Father 21   Bone cancer Father        mets from prostate cancer   Rheum arthritis Brother 49   Heart disease Maternal Grandmother        valve replacement   Heart attack Maternal Grandfather 40   Heart disease Paternal Grandmother     Alcohol abuse Paternal Grandfather    Emphysema Paternal Grandfather    Lupus Paternal Aunt     Social History   Tobacco Use   Smoking status: Former    Types: Cigarettes    Quit date: 2019    Years since quitting: 3.8   Smokeless tobacco: Never  Vaping Use   Vaping Use: Never used  Substance Use Topics   Alcohol use: Yes    Comment: Rarely   Drug use: Yes    Types: Marijuana    Comment: delta 8 - helps with pain mgmt    Home Medications Prior to Admission medications   Medication Sig Start Date End Date Taking? Authorizing Provider  albuterol (VENTOLIN HFA) 108 (90 Base) MCG/ACT inhaler Inhale 2 puffs into the lungs every 6 (six) hours as needed. 10/04/20   [provider]  B Complex Vitamins (B COMPLEX PO) Take by mouth daily.    [provider]  Cholecalciferol (VITAMIN D3 PO) Take by mouth daily.    [provider]  EPINEPHrine 0.3 mg/0.3 mL IJ SOAJ injection Inject 0.3 mg into the muscle as needed for anaphylaxis. 12/02/20   Wynn Banker, MD  meloxicam (MOBIC) 7.5 MG tablet TAKE 1 TABLET BY MOUTH EVERY DAY 02/26/21   Judi Saa, DO  QUERCETIN PO Take by mouth daily.    [provider]  tiZANidine (ZANAFLEX) 2 MG tablet Take  1 tablet (2 mg total) by mouth at bedtime. 03/22/21   Judi Saa, DO  traZODone (DESYREL) 50 MG tablet Take 1 tablet (50 mg total) by mouth at bedtime. 01/29/21   Wynn Banker, MD  triamcinolone ointment (KENALOG) 0.1 % Apply 1 application topically 2 (two) times daily as needed. APPLY 1 APPLICATION TOPICALLY 2 (TWO) TIMES DAILY AS NEEDED. 12/04/20   Alfonse Spruce, MD    Allergies    Corn-containing products, Eggs or egg-derived products, Gabapentin, Peanut-containing drug products, Soy allergy, Sulfasalazine, and Wheat bran  Review of Systems   Review of Systems  Constitutional:  Negative for fever.  Musculoskeletal:        Foot wound  Skin:  Positive for wound.  Neurological:   Negative for weakness and numbness.       Tingling to foot   Physical Exam Updated Vital Signs BP 118/79 (BP Location: Left Arm)   Pulse 93   Temp 98 F (36.7 C) (Oral)   Resp 16   Ht 5\' 11"  (1.803 m)   Wt 87.1 kg   SpO2 98%   BMI 26.78 kg/m   Physical Exam Constitutional:      General: He is not in acute distress.    Appearance: He is well-developed.  Eyes:     Conjunctiva/sclera: Conjunctivae normal.  Cardiovascular:     Rate and Rhythm: Normal rate.  Pulmonary:     Effort: Pulmonary effort is normal.  Musculoskeletal:     Comments: 1cm puncture wound noted between the great toe and the 2nd toe  Skin:    General: Skin is warm and dry.     Capillary Refill: Capillary refill takes less than 2 seconds.  Neurological:     Mental Status: He is alert and oriented to person, place, and time.      ED Results / Procedures / Treatments   Labs (all labs ordered are listed, but only abnormal results are displayed) Labs Reviewed - No data to display  EKG None  Radiology DG Foot Complete Left  Result Date: 04/24/2021 CLINICAL DATA:  Status post trauma. EXAM: LEFT FOOT - COMPLETE 3+ VIEW COMPARISON:  None. FINDINGS: There is no evidence of fracture or dislocation. There is no evidence of arthropathy. A small bone island is seen within the distal phalanx of the left great toe. Soft tissues are unremarkable. IMPRESSION: Negative. Electronically Signed   By: 04/26/2021 M.D.   On: 04/24/2021 15:10    Procedures Procedures   Medications Ordered in ED Medications  Tdap (BOOSTRIX) injection 0.5 mL (0.5 mLs Intramuscular Given 04/24/21 1559)    ED Course  I have reviewed the triage vital signs and the nursing notes.  Pertinent labs & imaging results that were available during my care of the patient were reviewed by me and considered in my medical decision making (see chart for details).    MDM Rules/Calculators/A&P                          45 y/o M presents for  eval of puncture wound to the right foot  Tdap was updated. Wound was pressure irrigated copiously with 2.5L NS. Wound was dressed and was not closed with sutures/staples due to concern for increased risk of infection as wound likely contaminated. Xray reviewed/interpreted and neg for bony abnormality. We will have him f/u with ortho due to concern for possible nerve injury given his reports of paresthesias. He has good cap  refill distally so doubt vascular injury  3:52 PM Antonieta Loveless with pharmacy who recommends levofloxacin for abx prophylaxis  Pt advised on importance of wound care and abx adherence. Advised on plan for f/u and strict return precautions. He voices understanding and is in agreement with plan. All questions answered, pt stable for discharge   Final Clinical Impression(s) / ED Diagnoses Final diagnoses:  Puncture wound    Rx / DC Orders ED Discharge Orders     None        Rayne Du 04/24/21 1602    Gerhard Munch, MD 04/24/21 1910

## 2021-04-24 NOTE — Progress Notes (Signed)
Orthopedic Tech Progress Note Patient Details:  Darrell Jordan 22-Dec-1975 [id number]  Ortho Devices Type of Ortho Device: Postop shoe/boot Ortho Device/Splint Location: left Ortho Device/Splint Interventions: Application   Post Interventions Patient Tolerated: Well Instructions Provided: Care of device  Saul Fordyce 04/24/2021, 4:37 PM

## 2021-04-24 NOTE — ED Triage Notes (Signed)
Patient was using a garden tool and while using he put the tool on his foot left foot. Left foot is continuing to bleed. Patient states the tool went through his left foot.

## 2021-04-24 NOTE — Discharge Instructions (Signed)
You were given a prescription for antibiotics. Please take the antibiotic prescription fully.   Please follow up with your primary care provider or with orthopedics within 5-7 days for re-evaluation of your symptoms.   please return to the emergency department for any new or worsening symptoms.

## 2021-04-24 NOTE — ED Provider Notes (Signed)
Emergency Medicine Provider Triage Evaluation Note  Darrell Jordan , a 45 y.o. male  was evaluated in triage.  Pt complains of left foot laceration.  Patient was working on a trailer in the garden when the tiller went through his left foot to the other side.  Last tetanus was over 7 years ago, will update today.  Patient is not diabetic, he does have RA but does not take any immunosuppressants..  Not on any blood thinners  Review of Systems  Positive: Left foot pain, toe and second toe tingling. Negative:   Physical Exam  BP 118/79 (BP Location: Left Arm)   Pulse 93   Temp 98 F (36.7 C) (Oral)   Resp 16   Ht 5\' 11"  (1.803 m)   Wt 87.1 kg   SpO2 98%   BMI 26.78 kg/m  Gen:   Awake, no distress   Resp:  Normal effort  MSK:   Able to flex and extend at each of the digits on his left foot.  Able to flex and extend at the ankle.  Bleeding controlled, DP and PT 2+.  Cap refill less than 2. Other:     Medical Decision Making  Medically screening exam initiated at 2:19 PM.  Appropriate orders placed.  Darrell Jordan was informed that the remainder of the evaluation will be completed by another provider, this initial triage assessment does not replace that evaluation, and the importance of remaining in the ED until their evaluation is complete.  Darrell Jordan went all the way through his foot, will get x-ray to evaluate for any bony impingement.  Patient will need tetanus update, laceration repair.   Coralee North, PA-C 04/24/21 1421    04/26/21, MD 04/27/21 2032

## 2021-04-29 ENCOUNTER — Ambulatory Visit: Payer: 59 | Admitting: Family Medicine

## 2021-05-04 ENCOUNTER — Other Ambulatory Visit: Payer: Self-pay | Admitting: Family Medicine

## 2021-05-05 MED ORDER — TIZANIDINE HCL 2 MG PO TABS
2.0000 mg | ORAL_TABLET | Freq: Every day | ORAL | 0 refills | Status: DC
Start: 2021-05-05 — End: 2021-09-02

## 2021-05-05 MED ORDER — MELOXICAM 7.5 MG PO TABS: 7.5000 mg | ORAL_TABLET | Freq: Every day | ORAL | 0 refills | Status: DC

## 2021-05-06 ENCOUNTER — Ambulatory Visit: Payer: 59 | Admitting: Family Medicine

## 2021-05-07 ENCOUNTER — Encounter: Payer: Self-pay | Admitting: Internal Medicine

## 2021-05-07 ENCOUNTER — Ambulatory Visit: Payer: 59 | Admitting: Internal Medicine

## 2021-05-07 VITALS — BP 120/84 | HR 67 | Temp 98.2°F | Wt 198.9 lb

## 2021-05-07 DIAGNOSIS — R202 Paresthesia of skin: Secondary | ICD-10-CM | POA: Diagnosis not present

## 2021-05-07 NOTE — Progress Notes (Signed)
Acute office Visit     This visit occurred during the SARS-CoV-2 public health emergency.  Safety protocols were in place, including screening questions prior to the visit, additional usage of staff PPE, and extensive cleaning of exam room while observing appropriate contact time as indicated for disinfecting solutions.    CC/Reason for Visit: Left foot numbness  HPI: Darrell Jordan is a 45 y.o. male who is coming in today for the above mentioned reasons.  On October 29 he suffered a puncture wound to the dorsum of his left foot in between his first and second metatarsals while he was tilling his garden.  He did go to the emergency department where the wound was irrigated, he was given prophylactic antibiotics in the form of Levaquin, he was given a Tdap booster.  He was advised at that time to proceed with orthopedics referral due to the paresthesias.  The numbness has persisted and he is now concerned.  Past Medical/Surgical History: Past Medical History:  Diagnosis Date   Mild intermittent asthma, uncomplicated [id number]   Rheumatoid arthritis ([place])     Past Surgical History:  Procedure Laterality Date   VASECTOMY  2010    Social History:  reports that he quit smoking about 3 years ago. His smoking use included cigarettes. He has never used smokeless tobacco. He reports current alcohol use. He reports current drug use. Drug: Marijuana.  Allergies: Allergies  Allergen Reactions   Corn-Containing Products    Eggs Or Egg-Derived Products    Gabapentin    Peanut-Containing Drug Products    Soy Allergy    Sulfasalazine    Wheat Bran     Family History:  Family History  Problem Relation Age of Onset   Hypotension Mother    Atrial fibrillation Mother    Other Father        joint pain, undiagnosed   Prostate cancer Father 62   Bone cancer Father        mets from prostate cancer   Rheum arthritis Brother 49   Heart disease Maternal Grandmother        valve replacement    Heart attack Maternal Grandfather 83   Heart disease Paternal Grandmother    Alcohol abuse Paternal Grandfather    Emphysema Paternal Grandfather    Lupus Paternal Aunt      Current Outpatient Medications:    albuterol (VENTOLIN HFA) 108 (90 Base) MCG/ACT inhaler, Inhale 2 puffs into the lungs every 6 (six) hours as needed., Disp: , Rfl:    B Complex Vitamins (B COMPLEX PO), Take by mouth daily., Disp: , Rfl:    Cholecalciferol (VITAMIN D3 PO), Take by mouth daily., Disp: , Rfl:    EPINEPHrine 0.3 mg/0.3 mL IJ SOAJ injection, Inject 0.3 mg into the muscle as needed for anaphylaxis., Disp: 2 each, Rfl: 11   meloxicam (MOBIC) 7.5 MG tablet, Take 1 tablet (7.5 mg total) by mouth daily., Disp: 30 tablet, Rfl: 0   QUERCETIN PO, Take by mouth daily., Disp: , Rfl:    tiZANidine (ZANAFLEX) 2 MG tablet, Take 1 tablet (2 mg total) by mouth at bedtime., Disp: 30 tablet, Rfl: 0   traZODone (DESYREL) 50 MG tablet, Take 1 tablet (50 mg total) by mouth at bedtime., Disp: 90 tablet, Rfl: 3   triamcinolone ointment (KENALOG) 0.1 %, Apply 1 application topically 2 (two) times daily as needed. APPLY 1 APPLICATION TOPICALLY 2 (TWO) TIMES DAILY AS NEEDED., Disp: 454 g, Rfl: 2  Review of Systems:  Constitutional: Denies fever, chills, diaphoresis, appetite change and fatigue.  HEENT: Denies photophobia, eye pain, redness, hearing loss, ear pain, congestion, sore throat, rhinorrhea, sneezing, mouth sores, trouble swallowing, neck pain, neck stiffness and tinnitus.   Respiratory: Denies SOB, DOE, cough, chest tightness,  and wheezing.   Cardiovascular: Denies chest pain, palpitations and leg swelling.  Gastrointestinal: Denies nausea, vomiting, abdominal pain, diarrhea, constipation, blood in stool and abdominal distention.  Genitourinary: Denies dysuria, urgency, frequency, hematuria, flank pain and difficulty urinating.  Endocrine: Denies: hot or cold intolerance, sweats, changes in hair or nails, polyuria,  polydipsia. Musculoskeletal: Denies myalgias, back pain, joint swelling. Skin: Denies pallor, rash and wound.  Neurological: Denies dizziness, seizures, syncope, weakness, light-headedness and headaches.  Hematological: Denies adenopathy. Easy bruising, personal or family bleeding history  Psychiatric/Behavioral: Denies suicidal ideation, mood changes, confusion, nervousness, sleep disturbance and agitation    Physical Exam: Vitals:   05/07/21 1115  BP: 120/84  Pulse: 67  Temp: 98.2 F (36.8 C)  TempSrc: Oral  SpO2: 96%  Weight: 198 lb 14.4 oz (90.2 kg)    Body mass index is 27.74 kg/m.   Constitutional: NAD, calm, comfortable Eyes: PERRL, lids and conjunctivae normal, wears corrective lenses ENMT: Mucous membranes are moist.  Skin: Puncture wound to left foot looks well-healed, no signs of active infection. Psychiatric: Normal judgment and insight. Alert and oriented x 3. Normal mood.    Impression and Plan:  Paresthesia of left foot  -Since the numbness is a consequence of the puncture wound to his foot, I believe referral to orthopedics is warranted.  Wonder if he might have had nerve damage.  Review of ED visit also shows that ED physician thought referral to orthopedics was necessary. - Plan: Ambulatory referral to Orthopedic Surgery     Aracelie Addis Philip Aspen, MD [facility]

## 2021-06-04 ENCOUNTER — Encounter: Payer: Self-pay | Admitting: Family Medicine

## 2021-06-04 ENCOUNTER — Ambulatory Visit (INDEPENDENT_AMBULATORY_CARE_PROVIDER_SITE_OTHER): Payer: 59 | Admitting: Family Medicine

## 2021-06-04 VITALS — BP 110/60 | HR 65 | Temp 97.8°F | Ht 71.0 in | Wt 197.5 lb

## 2021-06-04 DIAGNOSIS — Z0000 Encounter for general adult medical examination without abnormal findings: Secondary | ICD-10-CM | POA: Diagnosis not present

## 2021-06-04 DIAGNOSIS — Z8042 Family history of malignant neoplasm of prostate: Secondary | ICD-10-CM | POA: Diagnosis not present

## 2021-06-04 DIAGNOSIS — Z13220 Encounter for screening for lipoid disorders: Secondary | ICD-10-CM | POA: Diagnosis not present

## 2021-06-04 DIAGNOSIS — Z131 Encounter for screening for diabetes mellitus: Secondary | ICD-10-CM

## 2021-06-04 DIAGNOSIS — M791 Myalgia, unspecified site: Secondary | ICD-10-CM

## 2021-06-04 DIAGNOSIS — Z23 Encounter for immunization: Secondary | ICD-10-CM | POA: Diagnosis not present

## 2021-06-04 MED ORDER — MELOXICAM 7.5 MG PO TABS
7.5000 mg | ORAL_TABLET | Freq: Every day | ORAL | 1 refills | Status: DC
Start: 2021-06-04 — End: 2022-01-14

## 2021-06-04 NOTE — Progress Notes (Signed)
Darrell Jordan DOB: Nov 07, 1975 Encounter date: 06/04/2021  This is a 45 y.o. male who presents for complete physical   History of present illness/Additional concerns: Foot is getting better from puncture wound he had. Better healing in last week since another round of antibiotics. Still red, but healing. Pain better; just sore at end of day. Finished abx a week ago.   Insomnia: has cut back sleep which has actually helped with energy level. Was doing too much (like 11 hours), but seems to do better with 7-8 hours. Trazodone keeps him asleep and then wakes up feeling better.   Everything with joints has been ok. For a couple of months off of wheat has been feeling better. Did allergy testing in may. Definitely better than it was before. Combo of restricting sleep, diet changes are helping with pain overall. Had one flare of pinched nerve in neck in July. Did see rheumatology in august; didn't want to add anything at this point.   Past Medical History:  Diagnosis Date   Mild intermittent asthma, uncomplicated [id number]   Rheumatoid arthritis ([place])    Past Surgical History:  Procedure Laterality Date   VASECTOMY  2010   Allergies  Allergen Reactions   Corn-Containing Products    Eggs Or Egg-Derived Products    Gabapentin    Peanut-Containing Drug Products    Soy Allergy    Sulfasalazine    Wheat Bran    Current Meds  Medication Sig   albuterol (VENTOLIN HFA) 108 (90 Base) MCG/ACT inhaler Inhale 2 puffs into the lungs every 6 (six) hours as needed.   B Complex Vitamins (B COMPLEX PO) Take by mouth daily.   Cholecalciferol (VITAMIN D3 PO) Take by mouth daily.   EPINEPHrine 0.3 mg/0.3 mL IJ SOAJ injection Inject 0.3 mg into the muscle as needed for anaphylaxis.   QUERCETIN PO Take by mouth daily.   tiZANidine (ZANAFLEX) 2 MG tablet Take 1 tablet (2 mg total) by mouth at bedtime.   traZODone (DESYREL) 50 MG tablet Take 1 tablet (50 mg total) by mouth at bedtime.   [DISCONTINUED] meloxicam  (MOBIC) 7.5 MG tablet Take 1 tablet (7.5 mg total) by mouth daily.   Social History   Tobacco Use   Smoking status: Former    Types: Cigarettes    Quit date: 2019    Years since quitting: 3.9   Smokeless tobacco: Never  Substance Use Topics   Alcohol use: Yes    Comment: Rarely   Family History  Problem Relation Age of Onset   Hypotension Mother    Atrial fibrillation Mother    Other Father        joint pain, undiagnosed   Prostate cancer Father 41   Bone cancer Father        mets from prostate cancer   Rheum arthritis Brother 49   Heart disease Maternal Grandmother        valve replacement   Heart attack Maternal Grandfather 40   Heart disease Paternal Grandmother    Alcohol abuse Paternal Grandfather    Emphysema Paternal Grandfather    Lupus Paternal Aunt      Review of Systems  Constitutional:  Negative for activity change, appetite change, chills, fatigue, fever and unexpected weight change.  HENT:  Negative for congestion, ear pain, hearing loss, sinus pressure, sinus pain, sore throat and trouble swallowing.   Eyes:  Negative for pain and visual disturbance.  Respiratory:  Negative for cough, chest tightness, shortness of breath and wheezing.  Cardiovascular:  Negative for chest pain, palpitations and leg swelling.  Gastrointestinal:  Negative for abdominal distention, abdominal pain, blood in stool, constipation, diarrhea, nausea and vomiting.  Genitourinary:  Negative for decreased urine volume, difficulty urinating, dysuria, penile pain and testicular pain.  Musculoskeletal:  Positive for arthralgias. Negative for back pain and joint swelling.  Skin:  Negative for rash.  Neurological:  Negative for dizziness, weakness, numbness and headaches.  Hematological:  Negative for adenopathy. Does not bruise/bleed easily.  Psychiatric/Behavioral:  Negative for agitation, sleep disturbance and suicidal ideas. The patient is not nervous/anxious.    CBC:  Lab Results   Component Value Date   WBC 8.1 09/24/2020   HGB 15.3 09/24/2020   HCT 45.1 09/24/2020   MCHC 34.0 09/24/2020   RDW 12.4 09/24/2020   PLT 140.0 (L) 09/24/2020   CMP: Lab Results  Component Value Date   CALCIUM 9.8 09/24/2020   LIPID:No results found for: CHOL, TRIG, HDL, LDLCALC, LABVLDL  Objective:  BP 110/60 (BP Location: Left Arm, Patient Position: Sitting, Cuff Size: Normal)   Pulse 65   Temp 97.8 F (36.6 C) (Oral)   Ht 5\' 11"  (1.803 m)   Wt 197 lb 8 oz (89.6 kg)   SpO2 95%   BMI 27.55 kg/m   Weight: 197 lb 8 oz (89.6 kg)   BP Readings from Last 3 Encounters:  06/04/21 110/60  05/07/21 120/84  04/24/21 120/84   Wt Readings from Last 3 Encounters:  06/04/21 197 lb 8 oz (89.6 kg)  05/07/21 198 lb 14.4 oz (90.2 kg)  04/24/21 192 lb (87.1 kg)    Physical Exam Constitutional:      General: He is not in acute distress.    Appearance: He is well-developed.  HENT:     Head: Normocephalic and atraumatic.     Right Ear: External ear normal.     Left Ear: External ear normal.     Nose: Nose normal.     Mouth/Throat:     Pharynx: No oropharyngeal exudate.  Eyes:     Conjunctiva/sclera: Conjunctivae normal.     Pupils: Pupils are equal, round, and reactive to light.  Neck:     Thyroid: No thyromegaly.  Cardiovascular:     Rate and Rhythm: Normal rate and regular rhythm.     Heart sounds: Normal heart sounds. No murmur heard.   No friction rub. No gallop.  Pulmonary:     Effort: Pulmonary effort is normal. No respiratory distress.     Breath sounds: Normal breath sounds. No stridor. No wheezing or rales.  Abdominal:     General: Bowel sounds are normal.     Palpations: Abdomen is soft.  Genitourinary:    Penis: Normal and circumcised.   Musculoskeletal:        General: Normal range of motion.     Cervical back: Neck supple.  Skin:    General: Skin is warm and dry.  Neurological:     Mental Status: He is alert and oriented to person, place, and time.   Psychiatric:        Behavior: Behavior normal.        Thought Content: Thought content normal.        Judgment: Judgment normal.    Assessment/Plan: Health Maintenance Due  Topic Date Due   COLONOSCOPY (Pts 45-61yrs Insurance coverage will need to be confirmed)  Never done   COVID-19 Vaccine (3 - Booster for [employer] series) 07/25/2020   Health Maintenance reviewed - will refer  for colonoscopy, flu shot completed today.   1. Preventative health care Keep up with healthy lifestyle, regular exercise.   2. Muscle pain Doing better overall with pain. Using meloxicam and this does tend to give some relief. Following with sports med as needed.  - meloxicam (MOBIC) 7.5 MG tablet; Take 1 tablet (7.5 mg total) by mouth daily.  Dispense: 90 tablet; Refill: 1 - CBC with Differential/Platelet; Future  3. Family history of prostate cancer - PSA; Future  4. Lipid screening - Lipid panel; Future  5. Screening for diabetes mellitus - Comprehensive metabolic panel; Future  Return in about 1 year (around 06/04/2022) for physical exam; bloodwork 09/25/20.  Theodis Shove, MD

## 2021-06-07 ENCOUNTER — Telehealth: Payer: Self-pay | Admitting: *Deleted

## 2021-06-07 NOTE — Telephone Encounter (Signed)
Left a message for the patient to return my call.  

## 2021-06-07 NOTE — Telephone Encounter (Signed)
-----   Message from Junell C [name], MD sent at 06/05/2021  7:15 AM EST ----- °I have put in referral for colonoscopy since he is of age. If he has any hesitancy let me know. Otherwise he should get call to set up. °

## 2021-06-09 ENCOUNTER — Telehealth: Payer: Self-pay | Admitting: Family Medicine

## 2021-06-09 ENCOUNTER — Telehealth: Payer: Self-pay | Admitting: *Deleted

## 2021-06-09 NOTE — Telephone Encounter (Signed)
See prior phone note. 

## 2021-06-09 NOTE — Telephone Encounter (Signed)
Patient informed of the message below.

## 2021-06-09 NOTE — Telephone Encounter (Signed)
-----   Message from Wynn Banker, MD sent at 06/05/2021  7:15 AM EST ----- I have put in referral for colonoscopy since he is of age. If he has any hesitancy let me know. Otherwise he should get call to set up.

## 2021-06-09 NOTE — Telephone Encounter (Signed)
Pt call and stated he is returning your call and want a call back. 

## 2021-06-11 ENCOUNTER — Ambulatory Visit: Payer: 59 | Admitting: Family Medicine

## 2021-06-14 ENCOUNTER — Encounter: Payer: Self-pay | Admitting: Family Medicine

## 2021-06-30 NOTE — Progress Notes (Signed)
Darrell Jordan [address] [place] [address] Phone: [phone] Subjective:   Darrell Jordan, am serving as a scribe for Dr. [name]. This visit occurred during the SARS-CoV-2 public health emergency.  Safety protocols were in place, including screening questions prior to the visit, additional usage of staff PPE, and extensive cleaning of exam room while observing appropriate contact time as indicated for disinfecting solutions.  I'm seeing this patient by the request  of:  [name], [name], MD  CC: Neck pain and headache follow-up  [id number]  Darrell Jordan is a 46 y.o. male coming in with complaint of back and neck pain. OMT 03/10/2021. Patient states that he has been doing ok since last visit. Had increase in pain this past weekend but believes that sugar might have caused pain.   Medications patient has been prescribed: Zanaflex         Reviewed prior external information including notes and imaging from previsou exam, outside providers and external EMR if available.   As well as notes that were available from care everywhere and other healthcare systems.  Did see that since we have seen patient patient was seen in the emergency room with a puncture wound  Past medical history, social, surgical and family history all reviewed in electronic medical record.  No pertanent information unless stated regarding to the chief complaint.   Past Medical History:  Diagnosis Date   Mild intermittent asthma, uncomplicated 11/28/2020   Rheumatoid arthritis ([facility])     Allergies  Allergen Reactions   Corn-Containing Products    Eggs Or Egg-Derived Products    Gabapentin    Peanut-Containing Drug Products    Soy Allergy    Sulfasalazine    Wheat Bran      Review of Systems:  No headache, visual changes, nausea, vomiting, diarrhea, constipation, dizziness, abdominal pain, skin rash, fevers, chills, night sweats, weight loss, swollen lymph  nodes, body aches, joint swelling, chest pain, shortness of breath, mood changes. POSITIVE muscle aches  Objective  Blood pressure 118/78, pulse 60, height 5\' 11"  (1.803 m), weight 196 lb (88.9 kg), SpO2 96 %.   General: No apparent distress alert and oriented x3 mood and affect normal, dressed appropriately.  HEENT: Pupils equal, extraocular movements intact  Respiratory: Patient's speak in full sentences and does not appear short of breath  Cardiovascular: No lower extremity edema, non tender, no erythema  Neck exam shows patient does have some loss of lordosis.  Some limited sidebending bilaterally right greater than left.  Osteopathic findings  C2 flexed rotated and side bent right C7 flexed rotated and side bent left T3 extended rotated and side bent right inhaled rib T5 extended rotated and side bent left L2 flexed rotated and side bent right Sacrum right on right       Assessment and Plan:  Degenerative cervical disc Known degenerative disc disease.  Mild exacerbation since we have seen patient.  Patient was not maybe not quite as active when patient did have a foot injury.  Patient did respond extremely well to osteopathic manipulation.  Has meloxicam and Zanaflex for breakthrough.  Patient does have a new insurance with a high deductible plans would like to decrease intervals to every 2 months.  We will see patient again in 2 to 3 months and we given him other options for a different type of chiropractic care or other modalities that could be helpful in the interim.    Nonallopathic problems  Decision today  to treat with OMT was based on Physical Exam  After verbal consent patient was treated with HVLA, ME, FPR techniques in cervical, rib, thoracic  areas  Patient tolerated the procedure well with improvement in symptoms  Patient given exercises, stretches and lifestyle modifications  See medications in patient instructions if given  Patient will follow up in 4-8  weeks      The above documentation has been reviewed and is accurate and complete Judi Saa, DO        Note: This dictation was prepared with Dragon dictation along with smaller phrase technology. Any transcriptional errors that result from this process are unintentional.

## 2021-07-01 ENCOUNTER — Other Ambulatory Visit: Payer: Self-pay

## 2021-07-01 ENCOUNTER — Encounter: Payer: Self-pay | Admitting: Family Medicine

## 2021-07-01 ENCOUNTER — Ambulatory Visit (INDEPENDENT_AMBULATORY_CARE_PROVIDER_SITE_OTHER): Payer: BC Managed Care – PPO | Admitting: Family Medicine

## 2021-07-01 VITALS — BP 118/78 | HR 60 | Ht 71.0 in | Wt 196.0 lb

## 2021-07-01 DIAGNOSIS — M9903 Segmental and somatic dysfunction of lumbar region: Secondary | ICD-10-CM | POA: Diagnosis not present

## 2021-07-01 DIAGNOSIS — M5030 Other cervical disc degeneration, unspecified cervical region: Secondary | ICD-10-CM

## 2021-07-01 DIAGNOSIS — M9904 Segmental and somatic dysfunction of sacral region: Secondary | ICD-10-CM

## 2021-07-01 DIAGNOSIS — M9901 Segmental and somatic dysfunction of cervical region: Secondary | ICD-10-CM

## 2021-07-01 DIAGNOSIS — M9902 Segmental and somatic dysfunction of thoracic region: Secondary | ICD-10-CM

## 2021-07-01 DIAGNOSIS — M9908 Segmental and somatic dysfunction of rib cage: Secondary | ICD-10-CM | POA: Diagnosis not present

## 2021-07-01 NOTE — Patient Instructions (Signed)
Look into [employer] or [facility] Send me ?s Look into sound therapy See me again in 2 months

## 2021-07-01 NOTE — Assessment & Plan Note (Signed)
Known degenerative disc disease.  Mild exacerbation since we have seen patient.  Patient was not maybe not quite as active when patient did have a foot injury.  Patient did respond extremely well to osteopathic manipulation.  Has meloxicam and Zanaflex for breakthrough.  Patient does have a new insurance with a high deductible plans would like to decrease intervals to every 2 months.  We will see patient again in 2 to 3 months and we given him other options for a different type of chiropractic care or other modalities that could be helpful in the interim.

## 2021-07-18 ENCOUNTER — Other Ambulatory Visit: Payer: Self-pay | Admitting: Family Medicine

## 2021-07-22 ENCOUNTER — Encounter: Payer: Self-pay | Admitting: Family Medicine

## 2021-07-28 ENCOUNTER — Telehealth: Payer: BC Managed Care – PPO | Admitting: Nurse Practitioner

## 2021-07-28 DIAGNOSIS — U071 COVID-19: Secondary | ICD-10-CM

## 2021-07-28 MED ORDER — BENZONATATE 100 MG PO CAPS: 100.0000 mg | ORAL_CAPSULE | Freq: Three times a day (TID) | ORAL | 0 refills | Status: DC | PRN

## 2021-07-28 MED ORDER — AMOXICILLIN-POT CLAVULANATE 875-125 MG PO TABS: 1.0000 | ORAL_TABLET | Freq: Two times a day (BID) | ORAL | 0 refills | Status: DC

## 2021-07-28 NOTE — Patient Instructions (Signed)
1. Take meds as prescribed 2. Use a cool mist humidifier especially during the winter months and when heat has been humid. 3. Use saline nose sprays frequently 4. Saline irrigations of the nose can be very helpful if done frequently.  * 4X daily for 1 week*  * Use of a nettie pot can be helpful with this. Follow directions with this* 5. Drink plenty of fluids 6. Keep thermostat turn down low 7.For any cough or congestion- tessalon 8. For fever or aces or pains- take tylenol or ibuprofen appropriate for age and weight.  * for fevers greater than 101 orally you may alternate ibuprofen and tylenol every  3 hours.    

## 2021-07-28 NOTE — Progress Notes (Signed)
Virtual Visit Consent   Darrell Jordan, you are scheduled for a virtual visit with Darrell Jordan, [place], a [facility] provider, today.     Just as with appointments in the office, your consent must be obtained to participate.  Your consent will be active for this visit and any virtual visit you may have with one of our providers in the next 365 days.     If you have a MyChart account, a copy of this consent can be sent to you electronically.  All virtual visits are billed to your insurance company just like a traditional visit in the office.    As this is a virtual visit, video technology does not allow for your provider to perform a traditional examination.  This may limit your provider's ability to fully assess your condition.  If your provider identifies any concerns that need to be evaluated in person or the need to arrange testing (such as labs, EKG, etc.), we will make arrangements to do so.     Although advances in technology are sophisticated, we cannot ensure that it will always work on either your end or our end.  If the connection with a video visit is poor, the visit may have to be switched to a telephone visit.  With either a video or telephone visit, we are not always able to ensure that we have a secure connection.     I need to obtain your verbal consent now.   Are you willing to proceed with your visit today? YES   [name] has provided verbal consent on 07/28/2021 for a virtual visit (video or telephone).   Darrell Hassell Done, FNP   Date: 07/28/2021 2:29 PM   Virtual Visit via Video Note   I, Darrell Jordan, connected with Darrell Jordan ([id number], 08-03-1975) on 07/28/21 at  2:30 PM EST by a video-enabled telemedicine application and verified that I am speaking with the correct person using two identifiers.  Location: Patient: Virtual Visit Location Patient: Home Provider: Virtual Visit Location Provider: Mobile   I discussed the limitations of evaluation and  management by telemedicine and the availability of in person appointments. The patient expressed understanding and agreed to proceed.    History of Present Illness: Darrell Jordan is a 46 y.o. who identifies as a male who was assigned male at birth, and is being seen today for covid positive.  HPI: URI  This is a new problem. The current episode started in the past 7 days. Progression since onset: was impriving the started feeling bad again today. Maximum temperature: 99.0. Associated symptoms include congestion, coughing, headaches, rhinorrhea, sinus pain, sneezing and a sore throat. He has tried decongestant for the symptoms. The treatment provided mild relief.   Review of Systems  HENT:  Positive for congestion, rhinorrhea, sinus pain, sneezing and sore throat.   Respiratory:  Positive for cough.   Neurological:  Positive for headaches.   Problems:  Patient Active Problem List   Diagnosis Date Noted   Pain in right hip 03/12/2021   Piriformis syndrome of both sides 02/10/2021   Left shoulder pain 12/24/2020   Dyshidrotic eczema 11/28/2020   Chronic rhinitis 11/28/2020   Mild intermittent asthma, uncomplicated [id number]   Anaphylactic shock due to adverse food reaction 11/28/2020   Degenerative cervical disc 03/25/2020   Nonallopathic lesion of cervical region 03/25/2020   Nonallopathic lesion of thoracic region 03/25/2020   Nonallopathic lesion of rib cage 03/25/2020   Seronegative rheumatoid arthritis ([place]) 03/25/2020   Adjustment  insomnia 03/20/2018   Recurrent major depressive disorder, in full remission ([place]) 03/20/2018   Depressive disorder 07/24/2017   Hyperinsulinism 07/24/2017   Nausea 07/24/2017    Allergies:  Allergies  Allergen Reactions   Corn-Containing Products    Eggs Or Egg-Derived Products    Gabapentin    Peanut-Containing Drug Products    Soy Allergy    Sulfasalazine    Wheat Bran    Medications:  Current Outpatient Medications:    albuterol (VENTOLIN  HFA) 108 (90 Base) MCG/ACT inhaler, Inhale 2 puffs into the lungs every 6 (six) hours as needed., Disp: , Rfl:    B Complex Vitamins (B COMPLEX PO), Take by mouth daily., Disp: , Rfl:    Cholecalciferol (VITAMIN D3 PO), Take by mouth daily., Disp: , Rfl:    EPINEPHrine 0.3 mg/0.3 mL IJ SOAJ injection, Inject 0.3 mg into the muscle as needed for anaphylaxis., Disp: 2 each, Rfl: 11   meloxicam (MOBIC) 7.5 MG tablet, Take 1 tablet (7.5 mg total) by mouth daily., Disp: 90 tablet, Rfl: 1   QUERCETIN PO, Take by mouth daily., Disp: , Rfl:    tiZANidine (ZANAFLEX) 2 MG tablet, Take 1 tablet (2 mg total) by mouth at bedtime., Disp: 30 tablet, Rfl: 0   traZODone (DESYREL) 50 MG tablet, Take 1 tablet (50 mg total) by mouth at bedtime., Disp: 90 tablet, Rfl: 3  Observations/Objective: Patient is well-developed, well-nourished in no acute distress.  Resting comfortably  at home.  Head is normocephalic, atraumatic.  No labored breathing.  Speech is clear and coherent with logical content.  Patient is alert and oriented at baseline.  Raspy nasal voice  cough  Assessment and Plan:  Coryon Bruegger in today with chief complaint of No chief complaint on file.   1. Lab test positive for detection of COVID-19 virus 1. Take meds as prescribed 2. Use a cool mist humidifier especially during the winter months and when heat has been humid. 3. Use saline nose sprays frequently 4. Saline irrigations of the nose can be very helpful if Jordan frequently.  * 4X daily for 1 week*  * Use of a nettie pot can be helpful with this. Follow directions with this* 5. Drink plenty of fluids 6. Keep thermostat turn down low 7.For any cough or congestion- tessalon perles 8. For fever or aces or pains- take tylenol or ibuprofen appropriate for age and weight.  * for fevers greater than 101 orally you may alternate ibuprofen and tylenol every  3 hours.    Meds ordered this encounter  Medications   benzonatate (TESSALON  PERLES) 100 MG capsule    Sig: Take 1 capsule (100 mg total) by mouth 3 (three) times daily as needed for cough.    Dispense:  20 capsule    Refill:  0    Order Specific Question:   Supervising Provider    Answer:   Sabra Heck, BRIAN [[id number]]   amoxicillin-clavulanate (AUGMENTIN) 875-125 MG tablet    Sig: Take 1 tablet by mouth 2 (two) times daily.    Dispense:  20 tablet    Refill:  0    Order Specific Question:   Supervising Provider    Answer:   Noemi Chapel [[id number]]        Follow Up Instructions: I discussed the assessment and treatment plan with the patient. The patient was provided an opportunity to ask questions and all were answered. The patient agreed with the plan and demonstrated an understanding of the instructions.  A  copy of instructions were sent to the patient via MyChart.  The patient was advised to call back or seek an in-person evaluation if the symptoms worsen or if the condition fails to improve as anticipated.  Time:  I spent 8 minutes with the patient via telehealth technology discussing the above problems/concerns.    Darrell Hassell Done, FNP

## 2021-08-18 ENCOUNTER — Encounter: Payer: Self-pay | Admitting: Family Medicine

## 2021-09-02 ENCOUNTER — Other Ambulatory Visit: Payer: Self-pay | Admitting: Family Medicine

## 2021-09-03 MED ORDER — TIZANIDINE HCL 2 MG PO TABS: 2.0000 mg | ORAL_TABLET | Freq: Every day | ORAL | 0 refills | Status: DC

## 2021-09-08 NOTE — Progress Notes (Signed)
?[name] ?[facility] ?[address] [place] [address] ?Phone: [phone] ?Subjective:   ?I, Nadine Counts, am serving as a Neurosurgeon for Dr. [name]. ?This visit occurred during the SARS-CoV-2 public health emergency.  Safety protocols were in place, including screening questions prior to the visit, additional usage of staff PPE, and extensive cleaning of exam room while observing appropriate contact time as indicated for disinfecting solutions.  ? ?I'm seeing this patient by the request  of:  [name], [name], MD ? ?CC: Neck pain follow-up and new onset of shoulder pain ? ?[id number]  ?Macdonald Razzak is a 46 y.o. male coming in with complaint of shoulder pain. Last seen for OMT in January 2023. Patient states left shoulder pain was seen in sept and did exercises and stretches. Helped. Feb fell and landed on elbow and has had constant shoulder pain since. Characterized as sore certain movements make it a sharp pain.  ? ?  ? ?Past Medical History:  ?Diagnosis Date  ? Mild intermittent asthma, uncomplicated 11/28/2020  ? Rheumatoid arthritis (HCC)   ? ?Past Surgical History:  ?Procedure Laterality Date  ? VASECTOMY  2010  ? ?Social History  ? ?Socioeconomic History  ? Marital status: Married  ?  Spouse name: Not on file  ? Number of children: Not on file  ? Years of education: Not on file  ? Highest education level: Not on file  ?Occupational History  ? Not on file  ?Tobacco Use  ? Smoking status: Former  ?  Types: Cigarettes  ?  Quit date: 2019  ?  Years since quitting: 4.2  ? Smokeless tobacco: Never  ?Vaping Use  ? Vaping Use: Never used  ?Substance and Sexual Activity  ? Alcohol use: Yes  ?  Comment: Rarely  ? Drug use: Yes  ?  Types: Marijuana  ?  Comment: delta 8 - helps with pain mgmt  ? Sexual activity: Not on file  ?Other Topics Concern  ? Not on file  ?Social History Narrative  ? Not on file  ? ?Social Determinants of Health  ? ?Financial Resource Strain: Not on file  ?Food  Insecurity: Not on file  ?Transportation Needs: Not on file  ?Physical Activity: Not on file  ?Stress: Not on file  ?Social Connections: Not on file  ? ?Allergies  ?Allergen Reactions  ? Corn-Containing Products   ? Eggs Or Egg-Derived Products   ? Gabapentin   ? Peanut-Containing Drug Products   ? Soy Allergy   ? Sulfasalazine   ? Wheat Bran   ? ?Family History  ?Problem Relation Age of Onset  ? Hypotension Mother   ? Atrial fibrillation Mother   ? Other Father   ?     joint pain, undiagnosed  ? Prostate cancer Father 39  ? Bone cancer Father   ?     mets from prostate cancer  ? Rheum arthritis Brother 80  ? Heart disease Maternal Grandmother   ?     valve replacement  ? Heart attack Maternal Grandfather 40  ? Heart disease Paternal Grandmother   ? Alcohol abuse Paternal Grandfather   ? Emphysema Paternal Grandfather   ? Lupus Paternal Aunt   ? ? ? ?Current Outpatient Medications (Cardiovascular):  ?  EPINEPHrine 0.3 mg/0.3 mL IJ SOAJ injection, Inject 0.3 mg into the muscle as needed for anaphylaxis. ? ?Current Outpatient Medications (Respiratory):  ?  albuterol (VENTOLIN HFA) 108 (90 Base) MCG/ACT inhaler, Inhale 2 puffs into the  lungs every 6 (six) hours as needed. ?  benzonatate (TESSALON PERLES) 100 MG capsule, Take 1 capsule (100 mg total) by mouth 3 (three) times daily as needed for cough. ? ?Current Outpatient Medications (Analgesics):  ?  meloxicam (MOBIC) 7.5 MG tablet, Take 1 tablet (7.5 mg total) by mouth daily. ? ? ?Current Outpatient Medications (Other):  ?  amoxicillin-clavulanate (AUGMENTIN) 875-125 MG tablet, Take 1 tablet by mouth 2 (two) times daily. ?  B Complex Vitamins (B COMPLEX PO), Take by mouth daily. ?  Cholecalciferol (VITAMIN D3 PO), Take by mouth daily. ?  QUERCETIN PO, Take by mouth daily. ?  tiZANidine (ZANAFLEX) 2 MG tablet, Take 1 tablet (2 mg total) by mouth at bedtime. ?  traZODone (DESYREL) 50 MG tablet, Take 1 tablet (50 mg total) by mouth at bedtime. ? ? ?Reviewed prior  external information including notes and imaging from  ?primary care provider ?As well as notes that were available from care everywhere and other healthcare systems. ? ?Past medical history, social, surgical and family history all reviewed in electronic medical record.  No pertanent information unless stated regarding to the chief complaint.  ? ?Review of Systems: ? No headache, visual changes, nausea, vomiting, diarrhea, constipation, dizziness, abdominal pain, skin rash, fevers, chills, night sweats, weight loss, swollen lymph nodes, body aches, joint swelling, chest pain, shortness of breath, mood changes. POSITIVE muscle aches ? ?Objective  ?Blood pressure 110/70, pulse 67, height  (1.803 m), weight 192 lb (87.1 kg), SpO2 96 %. ?  ?General: No apparent distress alert and oriented x3 mood and affect normal, dressed appropriately.  ?HEENT: Pupils equal, extraocular movements intact  ?Respiratory: Patient's speak in full sentences and does not appear short of breath  ?Cardiovascular: No lower extremity edema, non tender, no erythema  ?Gait normal with good balance and coordination.  ?MSK: Left shoulder exam shows the patient does have positive impingement noted.  Noted with Neer and Hawkins.  Tenderness to palpation over the sacroiliac joint. ? ?Limited muscular skeletal ultrasound was performed and interpreted by [name], M  ?Limited ultrasound of patient's left shoulder shows the patient does have hypoechoic changes mostly of the acromioclavicular joint.  Patient does have hypoechoic changes within the glenohumeral joint as well consistent with more of a subacromial and bursitis.  Questionable mild tearing that is intersubstance noted with increasing in Doppler flow of the pectoralis major tendon.  No retraction noted. ?Impression: AC effusion with underlying bursitis of the shoulder ? ?Procedure: Real-time Ultrasound Guided Injection of left acromioclavicular joint ?Device: GE Logiq Q7 ?Ultrasound  guided injection is preferred based studies that show increased duration, increased effect, greater accuracy, decreased procedural pain, increased response rate, and decreased cost with ultrasound guided versus blind injection.  ?Verbal informed consent obtained.  ?Time-out conducted.  ?Noted no overlying erythema, induration, or other signs of local infection.  ?Skin prepped in a sterile fashion.  ?Local anesthesia: Topical Ethyl chloride.  ?With sterile technique and under real time ultrasound guidance: With a 25-gauge half inch needle injected with 0.5 cc of 0.5% Marcaine and 0.5 cc of Kenalog 40 mg/mL ?Completed without difficulty  ?Pain immediately resolved suggesting accurate placement of the medication.  ?Advised to call if fevers/chills, erythema, induration, drainage, or persistent bleeding.  ?Impression: Technically successful ultrasound guided injection. ? ?Osteopathic findings ?C2 flexed rotated and side bent right ?C4 flexed rotated and side bent left ?C6 flexed rotated and side bent left ?T6 extended rotated and side bent right inhaled third rib ? ? ?  ?  Impression and Recommendations:  ?  ? ?The above documentation has been reviewed and is accurate and complete Lyndal Pulley, DO ? ? ? ?

## 2021-09-10 ENCOUNTER — Ambulatory Visit: Payer: 59 | Admitting: Internal Medicine

## 2021-09-14 ENCOUNTER — Ambulatory Visit (INDEPENDENT_AMBULATORY_CARE_PROVIDER_SITE_OTHER): Payer: BC Managed Care – PPO | Admitting: Family Medicine

## 2021-09-14 ENCOUNTER — Ambulatory Visit: Payer: Self-pay

## 2021-09-14 ENCOUNTER — Other Ambulatory Visit: Payer: Self-pay

## 2021-09-14 VITALS — BP 110/70 | HR 67 | Ht 71.0 in | Wt 192.0 lb

## 2021-09-14 DIAGNOSIS — M9902 Segmental and somatic dysfunction of thoracic region: Secondary | ICD-10-CM | POA: Diagnosis not present

## 2021-09-14 DIAGNOSIS — M9908 Segmental and somatic dysfunction of rib cage: Secondary | ICD-10-CM | POA: Diagnosis not present

## 2021-09-14 DIAGNOSIS — M9901 Segmental and somatic dysfunction of cervical region: Secondary | ICD-10-CM

## 2021-09-14 DIAGNOSIS — M0600 Rheumatoid arthritis without rheumatoid factor, unspecified site: Secondary | ICD-10-CM | POA: Diagnosis not present

## 2021-09-14 DIAGNOSIS — M25512 Pain in left shoulder: Secondary | ICD-10-CM

## 2021-09-14 NOTE — Assessment & Plan Note (Signed)
Chronic, probably some inflammation.  Discussed with patient about icing regimen and home exercises.  Does have the muscle relaxers.  Patient also has responded relatively well to anti-inflammatories.  Patient also responding fairly well to osteopathic manipulation.  Discussed icing regimen and home exercises.  Patient will follow-up again in 6 weeks ?

## 2021-09-14 NOTE — Patient Instructions (Signed)
See you again in 6 weeks ?Do prescribed exercises at least 3x a week ?AC injection today ?

## 2021-09-14 NOTE — Assessment & Plan Note (Signed)
Acute injury with patient having hypoechoic changes.  Given injection today and tolerated the procedure well, discussed icing regimen and home exercises.  Work with Event organiserathletic trainer to learn home exercises in greater detail.  Patient will follow-up with me again in 6 to 8 weeks.  If worsening pain may consider formal physical therapy.  No weakness of the rotator cuff noted today. ?

## 2021-09-17 ENCOUNTER — Ambulatory Visit: Payer: BC Managed Care – PPO | Admitting: Family Medicine

## 2021-10-12 ENCOUNTER — Encounter: Payer: Self-pay | Admitting: Family Medicine

## 2021-10-18 ENCOUNTER — Encounter: Payer: Self-pay | Admitting: Family Medicine

## 2021-10-18 ENCOUNTER — Ambulatory Visit (INDEPENDENT_AMBULATORY_CARE_PROVIDER_SITE_OTHER): Payer: BC Managed Care – PPO | Admitting: Family Medicine

## 2021-10-18 VITALS — BP 120/70 | HR 71 | Temp 97.6°F | Ht 71.0 in | Wt 191.9 lb

## 2021-10-18 DIAGNOSIS — J069 Acute upper respiratory infection, unspecified: Secondary | ICD-10-CM | POA: Diagnosis not present

## 2021-10-18 DIAGNOSIS — R059 Cough, unspecified: Secondary | ICD-10-CM | POA: Diagnosis not present

## 2021-10-18 LAB — POCT INFLUENZA A/B
Influenza A, POC: NEGATIVE
Influenza B, POC: NEGATIVE

## 2021-10-18 LAB — POC COVID19 BINAXNOW: SARS Coronavirus 2 Ag: NEGATIVE

## 2021-10-18 MED ORDER — AMOXICILLIN-POT CLAVULANATE 875-125 MG PO TABS: 1.0000 | ORAL_TABLET | Freq: Two times a day (BID) | ORAL | 0 refills | Status: DC

## 2021-10-18 MED ORDER — BENZONATATE 100 MG PO CAPS: 100.0000 mg | ORAL_CAPSULE | Freq: Three times a day (TID) | ORAL | 0 refills | Status: DC | PRN

## 2021-10-18 NOTE — Progress Notes (Signed)
?  Subjective:  ?  ? Patient ID: Darrell Jordan, male   DOB: [birth year], 46 y.o.   MRN: [id number] ? ?HPI ?Seen with onset last Thursday of some body aches, nasal congestion, cough.  No fever.  Home COVID test negative.  COVID testing negative again here today.  He has history of RA but has not been on any immunosuppressive medications now at about 4 years and generally stable disease.  Has had some productive cough.  Some sinus pressure and congestion.  No sick contacts.  Smoking 2019 ? ?Past Medical History:  ?Diagnosis Date  ? Mild intermittent asthma, uncomplicated 11/28/2020  ? Rheumatoid arthritis (HCC)   ? ?Past Surgical History:  ?Procedure Laterality Date  ? VASECTOMY  2010  ? ? reports that he quit smoking about 4 years ago. His smoking use included cigarettes. He has never used smokeless tobacco. He reports current alcohol use. He reports current drug use. Drug: Marijuana. ?family history includes Alcohol abuse in his paternal grandfather; Atrial fibrillation in his mother; Bone cancer in his father; Emphysema in his paternal grandfather; Heart attack (age of onset: 67) in his maternal grandfather; Heart disease in his maternal grandmother and paternal grandmother; Hypotension in his mother; Lupus in his paternal aunt; Other in his father; Prostate cancer (age of onset: 62) in his father; Rheum arthritis (age of onset: 74) in his brother. ?Allergies  ?Allergen Reactions  ? Corn-Containing Products   ? Eggs Or Egg-Derived Products   ? Gabapentin   ? Peanut-Containing Drug Products   ? Soy Allergy   ? Sulfasalazine   ? Wheat Bran   ? ? ? ?Review of Systems  ?Constitutional:  Negative for chills and fever.  ?HENT:  Positive for congestion, sinus pressure and sinus pain.   ?Respiratory:  Positive for cough.   ?Cardiovascular:  Negative for chest pain.  ? ?   ?Objective:  ? Physical Exam ?Vitals reviewed.  ?Constitutional:   ?   Appearance: Normal appearance. He is not ill-appearing or toxic-appearing.  ?HENT:  ?    Right Ear: Tympanic membrane normal.  ?   Left Ear: Tympanic membrane normal.  ?Cardiovascular:  ?   Rate and Rhythm: Normal rate and regular rhythm.  ?Pulmonary:  ?   Effort: Pulmonary effort is normal.  ?   Breath sounds: Normal breath sounds. No wheezing or rales.  ?Musculoskeletal:  ?   Cervical back: Neck supple.  ?Lymphadenopathy:  ?   Cervical: No cervical adenopathy.  ?Neurological:  ?   Mental Status: He is alert.  ? ? ?   ?Assessment:  ?   ?Acute upper respiratory illness.  Influenza and COVID testing negative.  Does have some increased risk with his underlying RA.  Nontoxic in appearance.  Has had some progressive facial pain and productive coughing ?   ?Plan:  ?   ?-Tessalon Perles 100 mg 1 every 8 hours as needed for cough ?-Augmentin 875 mg twice daily with food for 7 days ?-Follow-up for any persistent or worsening symptoms. ? ?Kristian Covey MD ?[facility] ? ?   ?

## 2021-10-19 NOTE — Progress Notes (Deleted)
  [facility] Almyra Harper Phone: [phone] Subjective:    I'm seeing this patient by the request  of:  Caren Macadam, MD  CC:   [id number]  Darrell Jordan is a 46 y.o. male coming in with complaint of back and neck pain. OMT 09/14/2021. Also f/u for L shoulder pain. Patient states   Medications patient has been prescribed:   Taking:         Reviewed prior external information including notes and imaging from previsou exam, outside providers and external EMR if available.   As well as notes that were available from care everywhere and other healthcare systems.  Past medical history, social, surgical and family history all reviewed in electronic medical record.  No pertanent information unless stated regarding to the chief complaint.   Past Medical History:  Diagnosis Date   Mild intermittent asthma, uncomplicated 123456   Rheumatoid arthritis (HCC)     Allergies  Allergen Reactions   Corn-Containing Products    Eggs Or Egg-Derived Products    Gabapentin    Peanut-Containing Drug Products    Soy Allergy    Sulfasalazine    Wheat Bran      Review of Systems:  No headache, visual changes, nausea, vomiting, diarrhea, constipation, dizziness, abdominal pain, skin rash, fevers, chills, night sweats, weight loss, swollen lymph nodes, body aches, joint swelling, chest pain, shortness of breath, mood changes. POSITIVE muscle aches  Objective  There were no vitals taken for this visit.   General: No apparent distress alert and oriented x3 mood and affect normal, dressed appropriately.  HEENT: Pupils equal, extraocular movements intact  Respiratory: Patient's speak in full sentences and does not appear short of breath  Cardiovascular: No lower extremity edema, non tender, no erythema  Neuro: Cranial nerves II through XII are intact, neurovascularly intact in all extremities with 2+ DTRs and 2+ pulses.  Gait  normal with good balance and coordination.  MSK:  Non tender with full range of motion and good stability and symmetric strength and tone of shoulders, elbows, wrist, hip, knee and ankles bilaterally.  Back - Normal skin, Spine with normal alignment and no deformity.  No tenderness to vertebral process palpation.  Paraspinous muscles are not tender and without spasm.   Range of motion is full at neck and lumbar sacral regions  Osteopathic findings  C2 flexed rotated and side bent right C6 flexed rotated and side bent left T3 extended rotated and side bent right inhaled rib T9 extended rotated and side bent left L2 flexed rotated and side bent right Sacrum right on right       Assessment and Plan:    Nonallopathic problems  Decision today to treat with OMT was based on Physical Exam  After verbal consent patient was treated with HVLA, ME, FPR techniques in cervical, rib, thoracic, lumbar, and sacral  areas  Patient tolerated the procedure well with improvement in symptoms  Patient given exercises, stretches and lifestyle modifications  See medications in patient instructions if given  Patient will follow up in 4-8 weeks      The above documentation has been reviewed and is accurate and complete Jacqualin Combes       Note: This dictation was prepared with Dragon dictation along with smaller phrase technology. Any transcriptional errors that result from this process are unintentional.

## 2021-10-20 ENCOUNTER — Other Ambulatory Visit: Payer: Self-pay | Admitting: Family Medicine

## 2021-10-21 ENCOUNTER — Ambulatory Visit: Payer: BC Managed Care – PPO | Admitting: Family Medicine

## 2021-10-21 MED ORDER — ALBUTEROL SULFATE HFA 108 (90 BASE) MCG/ACT IN AERS: 2.0000 | INHALATION_SPRAY | Freq: Four times a day (QID) | RESPIRATORY_TRACT | 2 refills | Status: DC | PRN

## 2021-11-01 ENCOUNTER — Ambulatory Visit: Payer: BC Managed Care – PPO | Admitting: Family Medicine

## 2021-11-04 NOTE — Progress Notes (Signed)
?[name] ?[facility] ?[address] [place] [address] ?Phone: [phone] ?Subjective:   ?I, Darrell Jordan, am serving as a scribe for Dr. [name]. ? ?This visit occurred during the SARS-CoV-2 public health emergency.  Safety protocols were in place, including screening questions prior to the visit, additional usage of staff PPE, and extensive cleaning of exam room while observing appropriate contact time as indicated for disinfecting solutions.  ? ? ?I'm seeing this patient by the request  of:  [name], [name], MD ? ?CC: Left shoulder and neck pain ? ?[id number]  ?09/14/2021 ?Chronic, probably some inflammation.  Discussed with patient about icing regimen and home exercises.  Does have the muscle relaxers.  Patient also has responded relatively well to anti-inflammatories.  Patient also responding fairly well to osteopathic manipulation.  Discussed icing regimen and home exercises.  Patient will follow-up again in 6 weeks ? ?Acute injury with patient having hypoechoic changes.  Given injection today and tolerated the procedure well, discussed icing regimen and home exercises.  Work with Event organiser to learn home exercises in greater detail.  Patient will follow-up with me again in 6 to 8 weeks.  If worsening pain may consider formal physical therapy.  No weakness of the rotator cuff noted today. ? ?Update 11/05/2021 ?[name] is a 46 y.o. male coming in with complaint of back and neck pain. Also f/u for L AC joint pain. Injected last visit. Patient states that he did re-injure his shoulder as he fell. Fall did increase his L shoulder pain. Also had some sharp pain when rolling over in bed the other night. Most painful with ER over anterior and middle deltoid. Did have some pain that radiated down into bicep.  ? ?No change in pain neck or back.  ? ?Medications patient has been prescribed: Zanaflex ? ?Taking: ? ? ?  ? ? ? ? ?Reviewed prior external information including  notes and imaging from previsou exam, outside providers and external EMR if available.  ? ?As well as notes that were available from care everywhere and other healthcare systems. ? ?Past medical history, social, surgical and family history all reviewed in electronic medical record.  No pertanent information unless stated regarding to the chief complaint.  ? ?Past Medical History:  ?Diagnosis Date  ? Mild intermittent asthma, uncomplicated 11/28/2020  ? Rheumatoid arthritis (HCC)   ?  ?Allergies  ?Allergen Reactions  ? Corn-Containing Products   ? Eggs Or Egg-Derived Products   ? Gabapentin   ? Peanut-Containing Drug Products   ? Soy Allergy   ? Sulfasalazine   ? Wheat Bran   ? ? ? ?Review of Systems: ? No headache, visual changes, nausea, vomiting, diarrhea, constipation, dizziness, abdominal pain, skin rash, fevers, chills, night sweats, weight loss, swollen lymph nodes, body aches, joint swelling, chest pain, shortness of breath, mood changes. POSITIVE muscle aches ? ?Objective  ?Blood pressure 102/72, pulse 68, height  (1.803 m), weight 191 lb (86.6 kg), SpO2 97 %. ?  ?General: No apparent distress alert and oriented x3 mood and affect normal, dressed appropriately.  ?HEENT: Pupils equal, extraocular movements intact  ?Respiratory: Patient's speak in full sentences and does not appear short of breath  ?Cardiovascular: No lower extremity edema, non tender, no erythema  ?Neuro: Cranial nerves II through XII are intact, neurovascularly intact in all extremities with 2+ DTRs and 2+ pulses.  ?Gait normal with good balance and coordination.  ?MSK: Left shoulder exam shows the patient does have  positive impingement with Neer and Hawkins.  Worsening pain with external rotation mostly of the posterior capsule of the shoulder.  Negative crossover which is an improvement from previous exam.  4 out of 5 strength of the rotator cuff.  Positive O'Brien's. ? ?Limited muscular skeletal ultrasound was performed and  interpreted by [name], M  ?Limited ultrasound of patient's shoulder shows significant decrease in hypoechoic changes of the acromioclavicular joint from previous exam.  Patient does have increased hypoechoic changes of the glenohumeral joint especially posterior.  Calcific changes noted of the posterior labrum.  Mild hypoechoic changes noted in the subacromial space that it can be consistent with bursitis. ?Impression: Likely small effusion of the glenohumeral joint with mild changes of the posterior labrum ? ?Procedure: Real-time Ultrasound Guided Injection of left glenohumeral joint ?Device: GE Logiq E  ?Ultrasound guided injection is preferred based studies that show increased duration, increased effect, greater accuracy, decreased procedural pain, increased response rate with ultrasound guided versus blind injection.  ?Verbal informed consent obtained.  ?Time-out conducted.  ?Noted no overlying erythema, induration, or other signs of local infection.  ?Skin prepped in a sterile fashion.  ?Local anesthesia: Topical Ethyl chloride.  ?With sterile technique and under real time ultrasound guidance:  Joint visualized.  21g 2 inch needle inserted posterior approach. Pictures taken for needle placement. Patient did have injection of 2 cc of 0.5% Marcaine, and 1cc of Kenalog 40 mg/dL. ?Completed without difficulty  ?Pain immediately resolved suggesting accurate placement of the medication.  ?Advised to call if fevers/chills, erythema, induration, drainage, or persistent bleeding.  ?Impression: Technically successful ultrasound guided injection. ? ? ? ? ?Osteopathic findings ? ?C2 flexed rotated and side bent left ?C6 flexed rotated and side bent left ?T3 extended rotated and side bent left inhaled rib ?T9 extended rotated and side bent left ? ? ? ? ? ?  ?Assessment and Plan: ? ?Left shoulder pain ?The patient did have hypoechoic changes mostly of the glenohumeral joint.  Patient did not respond as well to the [facility]  injection previously given.  Patient did have some improvement in range of motion almost initially.  Discussed icing regimen and home exercises otherwise.  Differential still includes cervical radiculopathy or possible labral pathology.  If continuing to have pain I do think an MR arthrogram of the shoulder would be beneficial.  Hopefully the patient will make good progress and we will recheck again in 6 to 8 weeks ? ?Pain in left acromioclavicular joint ?Patient on exam today had decreased hypoechoic changes but no significant improvement of the pain from previous injection. ? ?Degenerative cervical disc ?Still some chronic problem but seems to be exacerbated secondary to the shoulder pain at the moment.  Did respond well to manipulation again today.  Discussed icing regimen and home exercises otherwise.  Has meloxicam which seems to be helping and does have Zanaflex for breakthrough if necessary.  ? ?Nonallopathic problems ? ?Decision today to treat with OMT was based on Physical Exam ? ?After verbal consent patient was treated with HVLA, ME, FPR techniques in cervical, rib, thoracic,areas ? ?Patient tolerated the procedure well with improvement in symptoms ? ?Patient given exercises, stretches and lifestyle modifications ? ?See medications in patient instructions if given ? ?Patient will follow up in 4-8 weeks ? ?  ? ? ?The above documentation has been reviewed and is accurate and complete Judi Saa, DO ? ? ? ?  ? ? Note: This dictation was prepared with Dragon dictation along with smaller  Company secretary. Any transcriptional errors that result from this process are unintentional.    ?  ?  ? ?

## 2021-11-05 ENCOUNTER — Encounter: Payer: Self-pay | Admitting: Family Medicine

## 2021-11-05 ENCOUNTER — Ambulatory Visit: Payer: BC Managed Care – PPO | Admitting: Family Medicine

## 2021-11-05 ENCOUNTER — Ambulatory Visit: Payer: Self-pay

## 2021-11-05 VITALS — BP 102/72 | HR 68 | Ht 71.0 in | Wt 191.0 lb

## 2021-11-05 DIAGNOSIS — M25512 Pain in left shoulder: Secondary | ICD-10-CM

## 2021-11-05 DIAGNOSIS — M9908 Segmental and somatic dysfunction of rib cage: Secondary | ICD-10-CM | POA: Diagnosis not present

## 2021-11-05 DIAGNOSIS — M9901 Segmental and somatic dysfunction of cervical region: Secondary | ICD-10-CM

## 2021-11-05 DIAGNOSIS — M5030 Other cervical disc degeneration, unspecified cervical region: Secondary | ICD-10-CM | POA: Diagnosis not present

## 2021-11-05 DIAGNOSIS — M9902 Segmental and somatic dysfunction of thoracic region: Secondary | ICD-10-CM | POA: Diagnosis not present

## 2021-11-05 NOTE — Patient Instructions (Addendum)
Dr. Claiborne Billings at [facility] ?Alysia Penna at [facility] ?Ria Clock at [facility] ? ?Injected shoulder joint today ?Send message in 2 weeks if not better consider MRI ?See me again in 6 weeks ?

## 2021-11-06 NOTE — Assessment & Plan Note (Signed)
Patient on exam today had decreased hypoechoic changes but no significant improvement of the pain from previous injection. ?

## 2021-11-06 NOTE — Assessment & Plan Note (Signed)
Still some chronic problem but seems to be exacerbated secondary to the shoulder pain at the moment.  Did respond well to manipulation again today.  Discussed icing regimen and home exercises otherwise.  Has meloxicam which seems to be helping and does have Zanaflex for breakthrough if necessary. ?

## 2021-11-06 NOTE — Assessment & Plan Note (Signed)
The patient did have hypoechoic changes mostly of the glenohumeral joint.  Patient did not respond as well to the [facility] injection previously given.  Patient did have some improvement in range of motion almost initially.  Discussed icing regimen and home exercises otherwise.  Differential still includes cervical radiculopathy or possible labral pathology.  If continuing to have pain I do think an MR arthrogram of the shoulder would be beneficial.  Hopefully the patient will make good progress and we will recheck again in 6 to 8 weeks ?

## 2021-11-15 NOTE — Progress Notes (Unsigned)
Office Visit Note  Patient: Darrell Jordan             Date of Birth: 03-20-1976           MRN: [id number]             PCP: Caren Macadam, MD Referring: Caren Macadam, MD Visit Date: 11/17/2021   Subjective:   History of Present Illness: Darrell Jordan is a 46 y.o. male here for follow up for a seronegative rheumatoid arthritis treating conservatively with oral meloxicam. He stopped taking the medication due to symptoms improving and also for concern about ongoing GI symptoms and affect on this. Symptoms had been doing very well until about March. Feels constant fatigue and as if muscles are all excessively sore and tight. Allergic rhinitis and cough symptoms are also severely worse than his usual amount this year.  He had COVID booster shot and recalls a week of feeling excellent after 2 days of pain but then worsened again. He has not seen any visible swelling or discoloration.  Previous HPI 03/12/2021 Darrell Jordan is a 46 y.o. male here for follow up for a seronegative rheumatoid arthritis treating conservatively with oral meloxicam. He has continued follow up with Dr. Tamala Julian for somatic dysfunction and took a short treatment with prednisone over the interval.  The most persistent symptom of all is a generalized severe muscle tightness and stiffness.  During the increased pain a few weeks ago he was also feeling worsening generalized symptoms such as brain fog and concentration difficulty.  Since finishing the most recent steroid treatment about 3 weeks ago he felt this has greatly improved again.  Some MCP joint swelling was described in outside clinic since our last visit but he reports it is currently improved.   Previous HPI 12/11/20 Darrell Jordan is a 46 y.o. male here for evaluation and management of seronegative rheumatoid arthritis. Recent visit with Dr. Tamala Julian noted ongoing joint pains and evidence of ongoing joint inflammation on exam. His rheumatoid arthritis was originally diagnosed  in [facility] 2016 after onset of numerous inflammatory problems with severe allergies, GI intolerance, and body pains. That started after an apparent fall onto head with no severe neck injury on xray or MRI. He tried several medications including HCQ did nothing for symptoms, SSZ caused excessive GI symptoms. He was a previous patient at [facility] rheumatology with previous treatment on methotrexate with Dr. Trudie Spatz that he tolerated for a while but felt very nauseated and drained after each dose. Review of documentation also suggests alternative causes for arthralgia including possible foods including gluten intolerance. He has felt a lot worse in the past month or two with fairly generalized pain increase and also allergic symptoms. He tried cutting all wheat from diet about 1.5 weeks ago and thinks this might be helping but pretty early to say. Symptoms are worst in his upper extremities, neck, and back and feels like his joints or muscles are too tight all the time.   Labs reviewed 08/2020 ANA neg RF neg CCP neg ESR 5 Uric acid 6.9 PTH wnl Testosterone wnl TSH wnl CBC Plts 140   Review of Systems  Constitutional:  Positive for fatigue.  HENT:  Negative for mouth dryness.   Eyes:  Negative for dryness.  Respiratory:  Positive for shortness of breath.   Cardiovascular:  Negative for swelling in legs/feet.  Gastrointestinal:  Negative for constipation.  Endocrine: Negative for excessive thirst.  Genitourinary:  Negative for difficulty urinating.  Musculoskeletal:  Positive  for joint pain, gait problem, joint pain, joint swelling, muscle weakness, morning stiffness and muscle tenderness.  Skin:  Negative for rash.  Allergic/Immunologic: Positive for susceptible to infections.  Neurological:  Positive for weakness.  Hematological:  Negative for bruising/bleeding tendency.  Psychiatric/Behavioral:  Negative for sleep disturbance.    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   Degenerative cervical disc 03/25/2020   Nonallopathic lesion of cervical region 03/25/2020   Nonallopathic lesion of thoracic region 03/25/2020   Nonallopathic lesion of rib cage 03/25/2020   Seronegative rheumatoid arthritis ([place]) 03/25/2020   Adjustment insomnia 03/20/2018   Recurrent major depressive disorder, in full remission ([place]) 03/20/2018   Depressive disorder 07/24/2017   Hyperinsulinism 07/24/2017   Nausea 07/24/2017    Past Medical History:  Diagnosis Date   Mild intermittent asthma, uncomplicated 11/29/3327   Rheumatoid arthritis ([place])     Family History  Problem Relation Age of Onset   Hypotension Mother    Atrial fibrillation Mother    Other Father        joint pain, undiagnosed   Prostate cancer Father 43   Bone cancer Father        mets from prostate cancer   Rheum arthritis Brother 49   Heart disease Maternal Grandmother        valve replacement   Heart attack Maternal Grandfather 86   Heart disease Paternal Grandmother    Alcohol abuse Paternal Grandfather    Emphysema Paternal Grandfather    Lupus Paternal Aunt    Past Surgical History:  Procedure Laterality Date   VASECTOMY  2010   Social History   Social History Narrative   Not on file   Immunization History  Administered Date(s) Administered   Influenza Inj Mdck Quad Pf 06/04/2021   Influenza Split 03/21/2017   Influenza,inj,Quad PF,6+ Mos 03/21/2017   Influenza,inj,Quad PF,6-35 Mos  03/20/2018, 06/12/2019, 04/24/2020   Janssen (J&J) SARS-COV-2 Vaccination 11/01/2019   PFIZER(Purple Top)SARS-COV-2 Vaccination 05/30/2020   Tdap 12/03/2015, 04/24/2021     Objective: Vital Signs: BP 117/73 (BP Location: Left Arm, Patient Position: Sitting, Cuff Size: Small)   Pulse 66   Resp 13   Ht 5' 11"  (1.803 m)   Wt 190 lb 6.4 oz (86.4 kg)   BMI 26.56 kg/m    Physical Exam HENT:     Nose: Congestion present.  Eyes:     Comments: Mild bilateral conjunctival injection  Cardiovascular:     Rate and Rhythm: Normal rate and regular rhythm.  Pulmonary:     Effort: Pulmonary effort is normal.     Breath sounds: Normal breath sounds.  Musculoskeletal:     Right lower leg: No edema.     Left lower leg: No edema.  Lymphadenopathy:     Cervical: No cervical adenopathy.  Skin:    General: Skin is warm and dry.     Findings: No rash.  Neurological:     Mental Status: He is alert.  Psychiatric:  Mood and Affect: Mood normal.     Musculoskeletal Exam:  Neck full ROM no tenderness Left shoulder pain worse with external rotation positive Neer's and Hawkins no obvious palpable effusion or warmth Elbows full ROM no tenderness or swelling Wrists full ROM no tenderness or swelling Fingers full ROM some MCP joint tenderness but with full range of motion no palpable synovitis Mild lateral hip pain provoked with FADIR maneuver Knees full ROM no tenderness or swelling Ankles full ROM no tenderness or swelling   Investigation: No additional findings.  Imaging: [place] LIMITED JOINT SPACE STRUCTURES UP LEFT(NO LINKED CHARGES)  Result Date: 11/22/2021 Limited muscular skeletal ultrasound was performed and interpreted by Hulan Saas, M Limited ultrasound of patient's shoulder shows significant decrease in hypoechoic changes of the acromioclavicular joint from previous exam.  Patient does have increased hypoechoic changes of the glenohumeral joint especially posterior.  Calcific  changes noted of the posterior labrum.  Mild hypoechoic changes noted in the subacromial space that it can be consistent with bursitis. Impression: Likely small effusion of the glenohumeral joint with mild changes of the posterior labrum   Procedure: Real-time Ultrasound Guided Injection of left glenohumeral joint Device: GE Logiq E Ultrasound guided injection is preferred based studies that show increased duration, increased effect, greater accuracy, decreased procedural pain, increased response rate with ultrasound guided versus blind injection. Verbal informed consent obtained. Time-out conducted. Noted no overlying erythema, induration, or other signs of local infection. Skin prepped in a sterile fashion. Local anesthesia: Topical Ethyl chloride. With sterile technique and under real time ultrasound guidance:  Joint visualized.  21g 2 inch needle inserted posterior approach. Pictures taken for needle placement. Patient did have injection of 2 cc of 0.5% Marcaine, and 1cc of Kenalog 40 mg/dL. Completed without difficulty Pain immediately resolved suggesting accurate placement of the medication. Advised to call if fevers/chills, erythema, induration, drainage, or persistent bleeding. Impression: Technically successful ultrasound guided injection.     Recent Labs: Lab Results  Component Value Date   WBC 8.1 09/24/2020   HGB 15.3 09/24/2020   PLT 140.0 (L) 09/24/2020   NA 139 11/17/2021   K 4.1 11/17/2021   CL 104 11/17/2021   CO2 25 11/17/2021   GLUCOSE 79 11/17/2021   BUN 16 11/17/2021   CREATININE 1.12 11/17/2021   BILITOT 0.6 11/17/2021   AST 21 11/17/2021   ALT 20 11/17/2021   PROT 7.0 11/17/2021   CALCIUM 9.7 11/17/2021    Speciality Comments: No specialty comments available.  Procedures:  No procedures performed Allergies: Corn-containing products, Eggs or egg-derived products, Gabapentin, Peanut-containing drug products, Soy allergy, Sulfasalazine, and Wheat bran   Assessment /  Plan:     Visit Diagnoses: Seronegative rheumatoid arthritis ([place]) - Plan: Sedimentation rate  Continued joint pain in multiple areas I do not see any definite synovitis or chronic inflammatory changes or damage.  We will repeat sed rate for inflammatory disease monitoring.  A lot of increased symptoms associated with his allergy and asthma history recently not sure if these are contributing to seronegative RA activity. Fatigue appears to be even bigger issue than joint inflammation right now. Reduced NSAID use for concern of gut symptoms might also be unmasking some inflammation.  Piriformis syndrome of both sides Pain in right hip  Still with some ongoing pain at the hip and low back, currently is much of a problem as his shoulder but has generalized exacerbation of joint symptoms.  Hyperlipidemia, unspecified hyperlipidemia type - Plan: Lipid panel  Plan for  repeat lipids to follow-up for PCP office monitoring.  We can check these labs for him today he will be needing reassignment as Dr. Marzella Schlein and is leaving that practice currently having some general medicine management with Dr. Tamala Julian.  Encounter for medication monitoring - Plan: COMPLETE METABOLIC PANEL WITH GFR, CANCELED: CMP14+EGFR  I think less likely to recommend restarting DMARD treatment at this time but will repeat metabolic panel also obtaining labs in association with the annual physical labs follow-up.  Screening PSA (prostate specific antigen) - Plan: PSA  Repeating screening PSA his has been stable with PCP office last year.  Seasonal allergies Chronic rhinitis Mild intermittent asthma, uncomplicated  Will need ot follow up with Dr. Ernst Bowler for management recommendation doing somewhat worse for about 2-3 months.   Orders: Orders Placed This Encounter  Procedures   COMPLETE METABOLIC PANEL WITH GFR   PSA   Lipid panel   Sedimentation rate   No orders of the defined types were placed in this  encounter.    Follow-Up Instructions: No follow-ups on file.   Collier Salina, MD  Note - This record has been created using [employer].  Chart creation errors have been sought, but may not always  have been located. Such creation errors do not reflect on  the standard of medical care.

## 2021-11-16 ENCOUNTER — Ambulatory Visit: Payer: BC Managed Care – PPO | Admitting: Family Medicine

## 2021-11-17 ENCOUNTER — Encounter: Payer: Self-pay | Admitting: Family Medicine

## 2021-11-17 ENCOUNTER — Ambulatory Visit: Payer: BC Managed Care – PPO | Admitting: Internal Medicine

## 2021-11-17 ENCOUNTER — Encounter: Payer: Self-pay | Admitting: Internal Medicine

## 2021-11-17 VITALS — BP 117/73 | HR 66 | Resp 13 | Ht 71.0 in | Wt 190.4 lb

## 2021-11-17 DIAGNOSIS — M0600 Rheumatoid arthritis without rheumatoid factor, unspecified site: Secondary | ICD-10-CM | POA: Diagnosis not present

## 2021-11-17 DIAGNOSIS — Z5181 Encounter for therapeutic drug level monitoring: Secondary | ICD-10-CM | POA: Diagnosis not present

## 2021-11-17 DIAGNOSIS — J302 Other seasonal allergic rhinitis: Secondary | ICD-10-CM

## 2021-11-17 DIAGNOSIS — M25551 Pain in right hip: Secondary | ICD-10-CM | POA: Diagnosis not present

## 2021-11-17 DIAGNOSIS — J310 Chronic rhinitis: Secondary | ICD-10-CM

## 2021-11-17 DIAGNOSIS — Z125 Encounter for screening for malignant neoplasm of prostate: Secondary | ICD-10-CM | POA: Diagnosis not present

## 2021-11-17 DIAGNOSIS — M0609 Rheumatoid arthritis without rheumatoid factor, multiple sites: Secondary | ICD-10-CM

## 2021-11-17 DIAGNOSIS — J4520 Mild intermittent asthma, uncomplicated: Secondary | ICD-10-CM

## 2021-11-17 DIAGNOSIS — G5703 Lesion of sciatic nerve, bilateral lower limbs: Secondary | ICD-10-CM | POA: Diagnosis not present

## 2021-11-17 DIAGNOSIS — E785 Hyperlipidemia, unspecified: Secondary | ICD-10-CM

## 2021-11-18 ENCOUNTER — Other Ambulatory Visit: Payer: Self-pay

## 2021-11-18 ENCOUNTER — Ambulatory Visit (INDEPENDENT_AMBULATORY_CARE_PROVIDER_SITE_OTHER): Payer: BC Managed Care – PPO | Admitting: Allergy & Immunology

## 2021-11-18 ENCOUNTER — Encounter: Payer: Self-pay | Admitting: Allergy & Immunology

## 2021-11-18 VITALS — BP 120/70 | HR 64 | Temp 98.3°F | Resp 20 | Wt 190.6 lb

## 2021-11-18 DIAGNOSIS — R5383 Other fatigue: Secondary | ICD-10-CM

## 2021-11-18 DIAGNOSIS — J310 Chronic rhinitis: Secondary | ICD-10-CM | POA: Diagnosis not present

## 2021-11-18 DIAGNOSIS — M25512 Pain in left shoulder: Secondary | ICD-10-CM

## 2021-11-18 DIAGNOSIS — T7800XD Anaphylactic reaction due to unspecified food, subsequent encounter: Secondary | ICD-10-CM

## 2021-11-18 DIAGNOSIS — J4520 Mild intermittent asthma, uncomplicated: Secondary | ICD-10-CM | POA: Diagnosis not present

## 2021-11-18 LAB — COMPLETE METABOLIC PANEL WITH GFR
AG Ratio: 1.9 (calc) (ref 1.0–2.5)
ALT: 20 U/L (ref 9–46)
AST: 21 U/L (ref 10–40)
Albumin: 4.6 g/dL (ref 3.6–5.1)
Alkaline phosphatase (APISO): 72 U/L (ref 36–130)
BUN: 16 mg/dL (ref 7–25)
CO2: 25 mmol/L (ref 20–32)
Calcium: 9.7 mg/dL (ref 8.6–10.3)
Chloride: 104 mmol/L (ref 98–110)
Creat: 1.12 mg/dL (ref 0.60–1.29)
Globulin: 2.4 g/dL (calc) (ref 1.9–3.7)
Glucose, Bld: 79 mg/dL (ref 65–99)
Potassium: 4.1 mmol/L (ref 3.5–5.3)
Sodium: 139 mmol/L (ref 135–146)
Total Bilirubin: 0.6 mg/dL (ref 0.2–1.2)
Total Protein: 7 g/dL (ref 6.1–8.1)
eGFR: 82 mL/min/{1.73_m2} (ref >=60)

## 2021-11-18 LAB — LIPID PANEL
Cholesterol: 172 mg/dL (ref <200)
HDL: 51 mg/dL (ref >=40)
LDL Cholesterol (Calc): 93 mg/dL (calc)
Non-HDL Cholesterol (Calc): 121 mg/dL (calc) (ref <130)
Total CHOL/HDL Ratio: 3.4 (calc) (ref <5.0)
Triglycerides: 188 mg/dL — ABNORMAL HIGH (ref <150)

## 2021-11-18 LAB — SEDIMENTATION RATE: Sed Rate: 6 mm/h (ref 0–15)

## 2021-11-18 LAB — PSA: PSA: 0.35 ng/mL (ref <=4.00)

## 2021-11-18 MED ORDER — EPINEPHRINE 0.3 MG/0.3ML IJ SOAJ: 0.3000 mg | INTRAMUSCULAR | 1 refills | Status: AC | PRN

## 2021-11-18 MED ORDER — RYALTRIS 665-25 MCG/ACT NA SUSP
1.0000 | Freq: Two times a day (BID) | NASAL | 5 refills | Status: DC
Start: 2021-11-18 — End: 2022-08-26

## 2021-11-18 NOTE — Patient Instructions (Addendum)
1. Anaphylactic shock due to food - Testing was only positive to wheat in the past. - Copy of previous testing results provided.  - You could do an elimination diet to try to figure out what is causing your symptoms.  - Introduce the foods that were negative.   2. Mild intermittent asthma, uncomplicated - Lung testing looks awesome. - Continue with albuterol as needed.  3. Environmental allergies - I would try changing to Allegra from Zyrtec since this cross into the brain the least amount. - Start using Ryaltris one spray per nostril twice daily to see if this helps (contains a steroid and a antihistamine).   4. Fatigue - We are going to refer you for a sleep study. - you may qualify for Inspire (implantable device for sleep apnea) since you are not overweight.   5. Return in about 6 months (around 05/21/2022).    Please inform us of any Emergency Department visits, hospitalizations, or changes in symptoms. Call us before going to the ED for breathing or allergy symptoms since we might be able to fit you in for a sick visit. Feel free to contact us anytime with any questions, problems, or concerns.  It was a pleasure to see you today!  Websites that have reliable patient information: 1. American Academy of Asthma, Allergy, and Immunology: www.aaaai.org 2. Food Allergy Research and Education (FARE): foodallergy.org 3. Mothers of Asthmatics: http://www.asthmacommunitynetwork.org 4. American College of Allergy, Asthma, and Immunology: www.acaai.org   COVID-19 Vaccine Information can be found at: [email or web address] For questions related to vaccine distribution or appointments, please email [email or web address] or call [phone].   We realize that you might be concerned about having an allergic reaction to the COVID19 vaccines. To help with that concern, WE ARE OFFERING THE COVID19 VACCINES IN OUR OFFICE! Ask the front desk  for dates!     "Like" [place] on Facebook and Instagram for our latest updates!      A healthy democracy works best when [employer] participate! Make sure you are registered to vote! If you have moved or changed any of your co tact information, you will need to get this updated before voting!  In some cases, you MAY be able to register to vote online: [email or web address]      Component     Latest Ref Rng 11/26/2020  IgE (Immunoglobulin E), Serum     6 - 495 IU/mL 37   D Pteronyssinus IgE     Class 0 kU/L <0.10   D Farinae IgE     Class 0 kU/L <0.10   Cat Dander IgE     Class 0 kU/L <0.10   Dog Dander IgE     Class 0 kU/L <0.10   [place] Grass IgE     Class I kU/L 0.43 !   Timothy Grass IgE     Class IV kU/L 6.41 !   [place] Grass IgE     Class I kU/L 0.44 !   Cockroach, [place] IgE     Class 0 kU/L <0.10   Penicillium Chrysogen IgE     Class 0 kU/L <0.10   Cladosporium Herbarum IgE     Class 0 kU/L <0.10   Aspergillus Fumigatus IgE     Class 0 kU/L <0.10   Alternaria Alternata IgE     Class 0 kU/L <0.10   Maple/Box Elder IgE     Class 0 kU/L <0.10   Common Silver Charletta Cousin IgE     Class 0 kU/L <0.10  [place], [place] IgE     Class 0/I kU/L 0.12 !   Oak, White IgE     Class 0 kU/L <0.10   Elm, American IgE     Class 0 kU/L <0.10   [place] IgE     Class 0 kU/L <0.10   Pecan, [place] IgE     Class 0 kU/L <0.10   White Mulberry IgE     Class 0 kU/L <0.10   Ragweed, Short IgE     Class III kU/L 2.40 !   Pigweed, Rough IgE     Class 0 kU/L <0.10   Sheep Sorrel IgE Qn     Class 0 kU/L <0.10   Mouse Urine IgE     Class 0 kU/L <0.10   Class Description Allergens Comment   F017-IgE Hazelnut ([place])     Class 0 kU/L <0.10   F256-IgE Walnut     Class 0 kU/L <0.10   F202-IgE Cashew Nut     Class 0 kU/L <0.10   F018-IgE [place] Nut     Class 0 kU/L <0.10   Peanut, IgE     Class 0 kU/L <0.10   Macadamia Nut, IgE     Class 0 kU/L  <0.10   Pecan Nut IgE     Class 0 kU/L <0.10   F203-IgE Pistachio Nut     Class 0 kU/L <0.10   F020-IgE Almond     Class 0 kU/L <0.10   F232-IgE Ovalbumin     Class 0 kU/L <0.10   F233-IgE Ovomucoid     Class 0 kU/L <0.10   Black Bean*, IgE     <0.35 kU/L <0.35   Class Interpretation 0   Soybean IgE     Class 0 kU/L <0.10   Allergen Corn, IgE     Class 0 kU/L <0.10   Chicken IgE     Class 0 kU/L <0.10   Pumpkin IgE     Class 0 kU/L <0.10   Sunflower Seed k84     Class 0 kU/L <0.10   Sesame Seed IgE     Class 0 kU/L <0.10   F309-IgE Chick Pea     Class 0 kU/L <0.10   Kidney Bean IgE     Class 0 kU/L <0.10   Wheat IgE     Class 0/I kU/L 0.24 !   Allergen Green Pea IgE     Class 0 kU/L <0.10   Allergen Salmon IgE     Class 0 kU/L <0.10   Tryptase     2.2 - 13.2 ug/L 4.6   Allergen Oat IgE     Class 0 kU/L <0.10   Allergen [place] IgE     Class 0 kU/L <0.10   Allergen Coconut IgE     Class 0 kU/L <0.10

## 2021-11-18 NOTE — Progress Notes (Signed)
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 Vernelle Emerald    Plan/Recommendations:   1. Anaphylactic shock due to food - Testing was only positive to wheat in the past. - Copy of previous testing results provided.  - You could do an elimination diet to try to figure out what is causing your symptoms.  - Introduce the foods that were negative.   2. Mild intermittent asthma, uncomplicated - Lung testing looks awesome. - Continue with albuterol as needed.  3. Environmental allergies - I would try changing to Allegra from Zyrtec since this cross into the brain the least amount. - Start using Ryaltris one spray per nostril twice daily to see if this helps (contains a steroid and a antihistamine).   4. Fatigue - We are going to refer you for a sleep study. - you may qualify for Inspire (implantable device for sleep apnea) since you are not overweight.   5. Return in about 6 months (around 05/21/2022).     Subjective:   Darrell Jordan is a 46 y.o. male presenting today for follow up of  Chief Complaint  Patient presents with   Asthma    It's ok.    Allergic Rhinitis     Congestion. In April had a cold that turned into pneumonia.    Medication Refill    Epi Refill.    Darrell Jordan has a history of the following: Patient Active Problem List   Diagnosis Date Noted   Hyperlipidemia 11/17/2021   Encounter for medication monitoring 11/17/2021   Screening PSA (prostate specific antigen) 11/17/2021   Seasonal allergies 11/17/2021   Pain in left acromioclavicular joint 09/14/2021   Pain in right hip 03/12/2021   Piriformis syndrome of both sides 02/10/2021   Left shoulder pain 12/24/2020   Dyshidrotic  eczema 11/28/2020   Chronic rhinitis 11/28/2020   Mild intermittent asthma, uncomplicated [id number]   Anaphylactic shock due to adverse food reaction 11/28/2020   Degenerative cervical disc 03/25/2020   Nonallopathic lesion of cervical region 03/25/2020   Nonallopathic lesion of thoracic region 03/25/2020   Nonallopathic lesion of rib cage 03/25/2020   Seronegative rheumatoid arthritis ([place]) 03/25/2020   Adjustment insomnia 03/20/2018   Recurrent major depressive disorder, in full remission ([place]) 03/20/2018   Depressive disorder 07/24/2017   Hyperinsulinism 07/24/2017   Nausea 07/24/2017    History obtained from: chart review and patient.  Darrell Jordan is a 46 y.o. male presenting for a follow up visit.  He was last seen in June 2022.  At that time, we got lab work to look for evidence of new allergies and see where his other allergy levels running out.  We also got a serum tryptase and provided him with Auvi-Q.  For his asthma, like testing looked awesome.  We did not think a controller medication was indicated.  For his environmental allergies.  We obtain blood work to look for environmental allergic sensitization.  For his dyshidrotic eczema, we started triamcinolone twice daily.  Environmental allergy was positive to grass, red cedar, and ragweed.  Testing to wheat was positive at 0.24, but otherwise all of the other foods were negative.  He presents today for follow-up visit.  His main complaint today seems to be  an intense feeling of fatigue.  He reports waking up every day around 50% refreshed.  He does take trazodone to help him get to sleep.  He does not snore unless he is sick.  His wife never reports that he stops breathing at night.  He has never been evaluated for sleep apnea, but he is open to that.  He last saw Dr. Benjamine Mola yesterday.  At that time, he reported feeling much worse in the past couple of months with an increase in pain.  From that note, it seems he has not been completely  gluten-free.  He has been having worsening pain in his upper extremities as well as his neck, back, and all joints.  Dr. Benjamine Mola felt that some of his symptoms might be due to uncontrolled allergies.  Pain levels are up a lot lately. He has Mobic and Zanaflex. He was taking Mobic but this was messing up his "gut health". He manages his pain with Delta 8 gummies to take the edge off without getting high.  This is a specific form of CBD which seems to be providing some relief.  Otherwise, there have been no changes to his past medical history, surgical history, family history, or social history.    Review of Systems  Constitutional:  Positive for malaise/fatigue. Negative for chills, fever and weight loss.  HENT: Negative.  Negative for congestion, ear discharge, ear pain and sinus pain.   Eyes:  Negative for pain, discharge and redness.  Respiratory:  Negative for cough, sputum production, shortness of breath and wheezing.   Cardiovascular: Negative.  Negative for chest pain and palpitations.  Gastrointestinal:  Negative for abdominal pain, constipation, diarrhea, heartburn, nausea and vomiting.  Skin: Negative.  Negative for itching and rash.  Neurological:  Positive for weakness. Negative for dizziness and headaches.  Endo/Heme/Allergies:  Negative for environmental allergies. Does not bruise/bleed easily.      Objective:   Blood pressure 120/70, pulse 64, temperature 98.3 F (36.8 C), temperature source Temporal, resp. rate 20, weight 190 lb 9.6 oz (86.5 kg), SpO2 97 %. Body mass index is 26.58 kg/m.    Physical Exam Vitals reviewed.  Constitutional:      Appearance: He is well-developed.     Comments: Very pleasant male. Cooperative with the exam.   HENT:     Head: Normocephalic and atraumatic.     Right Ear: Tympanic membrane, ear canal and external ear normal. No drainage, swelling or tenderness. Tympanic membrane is not injected, scarred, erythematous, retracted or bulging.      Left Ear: Tympanic membrane, ear canal and external ear normal. No drainage, swelling or tenderness. Tympanic membrane is not injected, scarred, erythematous, retracted or bulging.     Nose: No nasal deformity, septal deviation, mucosal edema or rhinorrhea.     Right Turbinates: Enlarged and swollen.     Left Turbinates: Enlarged and swollen.     Right Sinus: No maxillary sinus tenderness or frontal sinus tenderness.     Left Sinus: No maxillary sinus tenderness or frontal sinus tenderness.     Mouth/Throat:     Mouth: Mucous membranes are not pale and not dry.     Pharynx: Uvula midline.  Eyes:     General:        Right eye: No discharge.        Left eye: No discharge.     Conjunctiva/sclera: Conjunctivae normal.     Right eye: Right conjunctiva is not injected. No chemosis.    Left  eye: Left conjunctiva is not injected. No chemosis.    Pupils: Pupils are equal, round, and reactive to light.  Cardiovascular:     Rate and Rhythm: Normal rate and regular rhythm.     Heart sounds: Normal heart sounds.  Pulmonary:     Effort: Pulmonary effort is normal. No tachypnea, accessory muscle usage or respiratory distress.     Breath sounds: Normal breath sounds. No wheezing, rhonchi or rales.     Comments: Moving air well in all lung fields. Chest:     Chest wall: No tenderness.  Abdominal:     Tenderness: There is no abdominal tenderness. There is no guarding or rebound.  Lymphadenopathy:     Head:     Right side of head: No submandibular, tonsillar or occipital adenopathy.     Left side of head: No submandibular, tonsillar or occipital adenopathy.     Cervical: No cervical adenopathy.  Skin:    General: Skin is warm.     Capillary Refill: Capillary refill takes less than 2 seconds.     Coloration: Skin is not pale.     Findings: No abrasion, erythema, petechiae or rash. Rash is not papular, urticarial or vesicular.     Comments: No eczematous or urticarial lesions.  Neurological:      Mental Status: He is alert.  Psychiatric:        Behavior: Behavior is cooperative.     Diagnostic studies:    Spirometry: results normal (FEV1: 4.38/105%, FVC: 5.11/96%, FEV1/FVC: 86%).    Spirometry consistent with normal pattern.    Allergy Studies:   (labs from last time)   Component     Latest Ref Rng 11/26/2020  IgE (Immunoglobulin E), Serum     6 - 495 IU/mL 37   D Pteronyssinus IgE     Class 0 kU/L <0.10   D Farinae IgE     Class 0 kU/L <0.10   Cat Dander IgE     Class 0 kU/L <0.10   Dog Dander IgE     Class 0 kU/L <0.10   [place] Grass IgE     Class I kU/L 0.43 !   Timothy Grass IgE     Class IV kU/L 6.41 !   Johnson Grass IgE     Class I kU/L 0.44 !   Cockroach, [place] IgE     Class 0 kU/L <0.10   Penicillium Chrysogen IgE     Class 0 kU/L <0.10   Cladosporium Herbarum IgE     Class 0 kU/L <0.10   Aspergillus Fumigatus IgE     Class 0 kU/L <0.10   Alternaria Alternata IgE     Class 0 kU/L <0.10   Maple/Box Elder IgE     Class 0 kU/L <0.10   Common Silver Wendee Copp IgE     Class 0 kU/L <0.10   Cedar, [place] IgE     Class 0/I kU/L 0.12 !   Oak, White IgE     Class 0 kU/L <0.10   Elm, American IgE     Class 0 kU/L <0.10   [place] IgE     Class 0 kU/L <0.10   Pecan, Hickory IgE     Class 0 kU/L <0.10   White Mulberry IgE     Class 0 kU/L <0.10   Ragweed, Short IgE     Class III kU/L 2.40 !   Pigweed, Rough IgE     Class 0 kU/L <0.10   Sheep Sorrel IgE  Qn     Class 0 kU/L <0.10   Mouse Urine IgE     Class 0 kU/L <0.10   Class Description Allergens Comment   F017-IgE Hazelnut ([place])     Class 0 kU/L <0.10   F256-IgE Walnut     Class 0 kU/L <0.10   F202-IgE Cashew Nut     Class 0 kU/L <0.10   F018-IgE [place] Nut     Class 0 kU/L <0.10   Peanut, IgE     Class 0 kU/L <0.10   Macadamia Nut, IgE     Class 0 kU/L <0.10   Pecan Nut IgE     Class 0 kU/L <0.10   F203-IgE Pistachio Nut     Class 0 kU/L <0.10   F020-IgE Almond     Class  0 kU/L <0.10   F232-IgE Ovalbumin     Class 0 kU/L <0.10   F233-IgE Ovomucoid     Class 0 kU/L <0.10   Black Bean*, IgE     <0.35 kU/L <0.35   Class Interpretation 0   Soybean IgE     Class 0 kU/L <0.10   Allergen Corn, IgE     Class 0 kU/L <0.10   Chicken IgE     Class 0 kU/L <0.10   Pumpkin IgE     Class 0 kU/L <0.10   Sunflower Seed k84     Class 0 kU/L <0.10   Sesame Seed IgE     Class 0 kU/L <0.10   F309-IgE Chick Pea     Class 0 kU/L <0.10   Kidney Bean IgE     Class 0 kU/L <0.10   Wheat IgE     Class 0/I kU/L 0.24 !   Allergen Green Pea IgE     Class 0 kU/L <0.10   Allergen Salmon IgE     Class 0 kU/L <0.10   Tryptase     2.2 - 13.2 ug/L 4.6   Allergen Oat IgE     Class 0 kU/L <0.10   Allergen [place] IgE     Class 0 kU/L <0.10   Allergen Coconut IgE     Class 0 kU/L <0.10           Salvatore Marvel, MD  [facility]

## 2021-11-22 ENCOUNTER — Encounter: Payer: Self-pay | Admitting: Internal Medicine

## 2021-11-23 NOTE — Progress Notes (Signed)
I checked Darrell Jordan planned annual lab tests along with blood draw in clinic today. TGs mildly high otherwise looks okay, CMP and PSA are normal.  His sed rate was normal and no clear joint inflammation on exam so I'm not sure starting new RA treatment would be a large benefit at this time.

## 2021-11-24 ENCOUNTER — Encounter: Payer: Self-pay | Admitting: Family Medicine

## 2021-11-29 ENCOUNTER — Ambulatory Visit
Admission: RE | Admit: 2021-11-29 | Discharge: 2021-11-29 | Disposition: A | Discharge status: Home / Self Care | Payer: BC Managed Care – PPO | Source: Ambulatory Visit | Attending: Family Medicine | Admitting: Family Medicine

## 2021-11-29 DIAGNOSIS — M25511 Pain in right shoulder: Secondary | ICD-10-CM | POA: Diagnosis not present

## 2021-11-29 DIAGNOSIS — M25512 Pain in left shoulder: Secondary | ICD-10-CM | POA: Diagnosis not present

## 2021-11-29 DIAGNOSIS — M67814 Other specified disorders of tendon, left shoulder: Secondary | ICD-10-CM | POA: Diagnosis not present

## 2021-11-29 MED ORDER — IOPAMIDOL (ISOVUE-M 200) INJECTION 41%
12.0000 mL | Freq: Once | INTRAMUSCULAR | Status: AC
  Administered 2021-11-29: 12 mL via INTRA_ARTICULAR

## 2021-12-06 ENCOUNTER — Telehealth: Payer: Self-pay

## 2021-12-06 NOTE — Telephone Encounter (Signed)
Referral is under review with [facility]. Their office has to see if the referral diagnosis will be enough to scheduled a consult with their office.  Thanks

## 2021-12-06 NOTE — Telephone Encounter (Signed)
-----   Message from Alfonse Spruce, MD sent at 11/20/2021  8:04 AM EDT ----- Sleep study referral placed

## 2021-12-29 ENCOUNTER — Encounter: Payer: Self-pay | Admitting: Internal Medicine

## 2022-01-14 ENCOUNTER — Telehealth: Payer: Self-pay | Admitting: *Deleted

## 2022-01-14 ENCOUNTER — Ambulatory Visit (AMBULATORY_SURGERY_CENTER): Payer: Self-pay | Admitting: *Deleted

## 2022-01-14 VITALS — Ht 71.0 in | Wt 194.0 lb

## 2022-01-14 DIAGNOSIS — Z1211 Encounter for screening for malignant neoplasm of colon: Secondary | ICD-10-CM

## 2022-01-14 MED ORDER — NA SULFATE-K SULFATE-MG SULF 17.5-3.13-1.6 GM/177ML PO SOLN: 2.0000 | Freq: Once | ORAL | 0 refills | Status: AC

## 2022-01-14 NOTE — Telephone Encounter (Signed)
John- just wanted to make you aware that this pt who is scheduled for a colon on 8/11 stated he is allergic to both egg and soy products during his PV today.  Thank you,  Misty Stanley PV

## 2022-01-14 NOTE — Progress Notes (Addendum)
Pt states he is allergic to egg and soy products, message sent to Cathlyn Parsons No issues known to pt with past sedation with any surgeries or procedures Patient denies ever being told they had issues or difficulty with intubation  No FH of Malignant Hyperthermia Pt is not on diet pills Pt is not on  home 02  Pt is not on blood thinners  Pt denies issues with constipation  No A fib or A flutter   Discussed with pt there will be an out-of-pocket cost for prep and that varies from $0 to 70 +  dollars - pt verbalized understanding  Pt instructed to use Singlecare.com or GoodRx for a price reduction on prep   PV completed in person. Pt verified name, DOB.  Procedure explained to pt. Prep instructions reviewed, questions answered. Pt encouraged to call with questions or issues.  If pt has My chart, procedure instructions sent via My Chart

## 2022-02-01 ENCOUNTER — Encounter: Payer: Self-pay | Admitting: Internal Medicine

## 2022-02-04 ENCOUNTER — Encounter: Payer: Self-pay | Admitting: Internal Medicine

## 2022-02-04 ENCOUNTER — Ambulatory Visit (AMBULATORY_SURGERY_CENTER): Payer: BC Managed Care – PPO | Admitting: Internal Medicine

## 2022-02-04 VITALS — BP 109/68 | HR 48 | Temp 98.2°F | Resp 13 | Ht 71.0 in | Wt 194.0 lb

## 2022-02-04 DIAGNOSIS — Z1211 Encounter for screening for malignant neoplasm of colon: Secondary | ICD-10-CM | POA: Diagnosis not present

## 2022-02-04 MED ORDER — SODIUM CHLORIDE 0.9 % IV SOLN: 500.0000 mL | INTRAVENOUS | Status: DC

## 2022-02-04 NOTE — Progress Notes (Signed)
Pt's states no medical or surgical changes since previsit or office visit. 

## 2022-02-04 NOTE — Op Note (Signed)
[facility] Patient Name: Darrell Jordan Procedure Date: 02/04/2022 11:26 AM MRN: [id number] Endoscopist: Beverley Fiedler , MD Age: 46 Referring MD:  Date of Birth: [birth year] Gender: Male Account #: [id number] Procedure:                Colonoscopy Indications:              Screening for colorectal malignant neoplasm, This                            is the patient's first colonoscopy Medicines:                Monitored Anesthesia Care Procedure:                Pre-Anesthesia Assessment:                           - Prior to the procedure, a History and Physical                            was performed, and patient medications and                            allergies were reviewed. The patient's tolerance of                            previous anesthesia was also reviewed. The risks                            and benefits of the procedure and the sedation                            options and risks were discussed with the patient.                            All questions were answered, and informed consent                            was obtained. Prior Anticoagulants: The patient has                            taken no previous anticoagulant or antiplatelet                            agents. ASA Grade Assessment: II - A patient with                            mild systemic disease. After reviewing the risks                            and benefits, the patient was deemed in                            satisfactory condition to undergo the procedure.  After obtaining informed consent, the colonoscope                            was passed under direct vision. Throughout the                            procedure, the patient's blood pressure, pulse, and                            oxygen saturations were monitored continuously. The                            CF [id number] #[id number] was introduced through the anus                            and advanced to the cecum,  identified by                            appendiceal orifice and ileocecal valve. The                            colonoscopy was performed without difficulty. The                            patient tolerated the procedure well. The quality                            of the bowel preparation was fair in the right                            colon with adherent stool. Prep cleared with                            copious irrigation and lavage to adequate for                            polyps > 8 mm in the cecum/ascending colon. The                            ileocecal valve, appendiceal orifice, and rectum                            were photographed. Scope In: 11:30:21 AM Scope Out: 11:46:42 AM Scope Withdrawal Time: 0 hours 12 minutes 0 seconds  Total Procedure Duration: 0 hours 16 minutes 21 seconds  Findings:                 The digital rectal exam was normal.                           The entire examined colon appeared normal on direct                            and retroflexion views. Complications:  No immediate complications. Estimated Blood Loss:     Estimated blood loss: none. Impression:               - Preparation of the colon was fair in the                            cecum/ascending colon despite copious irrigation                            and lavage.                           - The entire examined colon is normal on direct and                            retroflexion views.                           - No specimens collected. Recommendation:           - Patient has a contact number available for                            emergencies. The signs and symptoms of potential                            delayed complications were discussed with the                            patient. Return to normal activities tomorrow.                            Written discharge instructions were provided to the                            patient.                           - Resume  previous diet.                           - Continue present medications.                           - Repeat colonoscopy for screening purposes at age                            58 with 2 day prep. Beverley Fiedler, MD 02/04/2022 11:50:11 AM This report has been signed electronically.

## 2022-02-04 NOTE — Progress Notes (Signed)
GASTROENTEROLOGY PROCEDURE H&P NOTE   Primary Care Physician: Wynn Banker, MD (Inactive)    Reason for Procedure:   Colon cancer screening  Plan:    colonoscopy  Patient is appropriate for endoscopic procedure(s) in the ambulatory ([facility]) setting.  The nature of the procedure, as well as the risks, benefits, and alternatives were carefully and thoroughly reviewed with the patient. Ample time for discussion and questions allowed. The patient understood, was satisfied, and agreed to proceed.     HPI: Darrell Jordan is a 46 y.o. male who presents for screening colonoscopy.  Medical history as below.  Tolerated the prep.  No recent chest pain or shortness of breath.  No abdominal pain today.  Past Medical History:  Diagnosis Date   Mild intermittent asthma, uncomplicated 11/28/2020   Rheumatoid arthritis [facility])     Past Surgical History:  Procedure Laterality Date   VASECTOMY  2010    Prior to Admission medications   Medication Sig Start Date End Date Taking? Authorizing Provider  B Complex Vitamins (B COMPLEX PO) Take by mouth daily.   Yes [provider]  Cetirizine HCl (ZYRTEC ALLERGY PO) Take by mouth.   Yes [provider]  Cholecalciferol (VITAMIN D3 PO) Take by mouth daily.   Yes [provider]  QUERCETIN PO Take by mouth daily.   Yes [provider]  traZODone (DESYREL) 50 MG tablet Take 1 tablet (50 mg total) by mouth at bedtime. 01/29/21  Yes [name], Junell C, MD  albuterol (VENTOLIN HFA) 108 (90 Base) MCG/ACT inhaler Inhale 2 puffs into the lungs every 6 (six) hours as needed. 10/21/21   [name], [name], MD  EPINEPHrine 0.3 mg/0.3 mL IJ SOAJ injection Inject 0.3 mg into the muscle as needed for anaphylaxis. 11/18/21   Alfonse Spruce, MD  Olopatadine-Mometasone Cristal Generous) [phone] MCG/ACT SUSP Place 1 spray into the nose 2 (two) times daily. 11/18/21   Alfonse Spruce, MD  tiZANidine (ZANAFLEX) 2 MG tablet Take 1  tablet (2 mg total) by mouth at bedtime. 09/03/21   Judi Saa, DO    Current Outpatient Medications  Medication Sig Dispense Refill   B Complex Vitamins (B COMPLEX PO) Take by mouth daily.     Cetirizine HCl (ZYRTEC ALLERGY PO) Take by mouth.     Cholecalciferol (VITAMIN D3 PO) Take by mouth daily.     QUERCETIN PO Take by mouth daily.     traZODone (DESYREL) 50 MG tablet Take 1 tablet (50 mg total) by mouth at bedtime. 90 tablet 3   albuterol (VENTOLIN HFA) 108 (90 Base) MCG/ACT inhaler Inhale 2 puffs into the lungs every 6 (six) hours as needed. 18 g 2   EPINEPHrine 0.3 mg/0.3 mL IJ SOAJ injection Inject 0.3 mg into the muscle as needed for anaphylaxis. 2 each 1   Olopatadine-Mometasone (RYALTRIS) 665-25 MCG/ACT SUSP Place 1 spray into the nose 2 (two) times daily. 29 g 5   tiZANidine (ZANAFLEX) 2 MG tablet Take 1 tablet (2 mg total) by mouth at bedtime. 30 tablet 0   Current Facility-Administered Medications  Medication Dose Route Frequency Provider Last Rate Last Admin   0.9 %  sodium chloride infusion  500 mL Intravenous Continuous Saxton Chain, Carie Caddy, MD        Allergies as of 02/04/2022 - Review Complete 02/04/2022  Allergen Reaction Noted   Corn-containing products  12/02/2020   Eggs or egg-derived products  12/02/2020   Gabapentin  03/25/2020   Peanut-containing drug products  12/02/2020   Soy allergy  12/02/2020   Sulfasalazine  12/11/2020   Wheat bran  12/11/2020    Family History  Problem Relation Age of Onset   Hypotension Mother    Atrial fibrillation Mother    Other Father        joint pain, undiagnosed   Prostate cancer Father 11   Bone cancer Father        mets from prostate cancer   Rheum arthritis Brother 16   Lupus Paternal Aunt    Heart disease Maternal Grandmother        valve replacement   Heart attack Maternal Grandfather 40   Esophageal cancer Paternal Grandmother    Heart disease Paternal Grandmother    Alcohol abuse Paternal Grandfather     Emphysema Paternal Grandfather    Colon polyps Neg Hx    Colon cancer Neg Hx    Stomach cancer Neg Hx    Rectal cancer Neg Hx     Social History   Socioeconomic History   Marital status: Married    Spouse name: Not on file   Number of children: Not on file   Years of education: Not on file   Highest education level: Not on file  Occupational History   Not on file  Tobacco Use   Smoking status: Former    Types: Cigarettes    Quit date: 2019    Years since quitting: 4.6    Passive exposure: Never   Smokeless tobacco: Never  Vaping Use   Vaping Use: Never used  Substance and Sexual Activity   Alcohol use: Yes    Comment: Rarely   Drug use: Yes    Types: Marijuana    Comment: delta 8 - helps with pain mgmt   Sexual activity: Not on file  Other Topics Concern   Not on file  Social History Narrative   Not on file   Social Determinants of Health   Financial Resource Strain: Not on file  Food Insecurity: Not on file  Transportation Needs: Not on file  Physical Activity: Not on file  Stress: Not on file  Social Connections: Not on file  Intimate Partner Violence: Not on file    Physical Exam: Vital signs in last 24 hours: @BP  116/73   Pulse (!) 52   Temp 98.2 F (36.8 C)   Resp 11   Ht 5\' 11"  (1.803 m)   Wt 194 lb (88 kg)   SpO2 97%   BMI 27.06 kg/m  GEN: NAD EYE: Sclerae anicteric ENT: MMM CV: Non-tachycardic Pulm: CTA b/l GI: Soft, NT/ND NEURO:  Alert & Oriented x 3   , MD [facility]  02/04/2022 11:23 AM

## 2022-02-04 NOTE — Progress Notes (Signed)
Report to PACU, RN, vss, BBS= Clear.  

## 2022-02-04 NOTE — Patient Instructions (Signed)
Resume previous diet and medications.  Repeat colonoscopy at age 46 for screening purposes with a 2 day prep.   YOU HAD AN ENDOSCOPIC PROCEDURE TODAY AT THE [facility]:   Refer to the procedure report that was given to you for any specific questions about what was found during the examination.  If the procedure report does not answer your questions, please call your gastroenterologist to clarify.  If you requested that your care partner not be given the details of your procedure findings, then the procedure report has been included in a sealed envelope for you to review at your convenience later.  YOU SHOULD EXPECT: Some feelings of bloating in the abdomen. Passage of more gas than usual.  Walking can help get rid of the air that was put into your GI tract during the procedure and reduce the bloating. If you had a lower endoscopy (such as a colonoscopy or flexible sigmoidoscopy) you may notice spotting of blood in your stool or on the toilet paper. If you underwent a bowel prep for your procedure, you may not have a normal bowel movement for a few days.  Please Note:  You might notice some irritation and congestion in your nose or some drainage.  This is from the oxygen used during your procedure.  There is no need for concern and it should clear up in a day or so.  SYMPTOMS TO REPORT IMMEDIATELY:  Following lower endoscopy (colonoscopy or flexible sigmoidoscopy):  Excessive amounts of blood in the stool  Significant tenderness or worsening of abdominal pains  Swelling of the abdomen that is new, acute  Fever of 100F or higher   For urgent or emergent issues, a gastroenterologist can be reached at any hour by calling [phone]. Do not use MyChart messaging for urgent concerns.    DIET:  We do recommend a small meal at first, but then you may proceed to your regular diet.  Drink plenty of fluids but you should avoid alcoholic beverages for 24 hours.  ACTIVITY:  You should  plan to take it easy for the rest of today and you should NOT DRIVE or use heavy machinery until tomorrow (because of the sedation medicines used during the test).    FOLLOW UP: Our staff will call the number listed on your records the next business day following your procedure.  We will call around 7:15- 8:00 am to check on you and address any questions or concerns that you may have regarding the information given to you following your procedure. If we do not reach you, we will leave a message.  If you develop any symptoms (ie: fever, flu-like symptoms, shortness of breath, cough etc.) before then, please call [phone].  If you test positive for Covid 19 in the 2 weeks post procedure, please call and report this information to [place].    If any biopsies were taken you will be contacted by phone or by letter within the next 1-3 weeks.  Please call us at [phone] if you have not heard about the biopsies in 3 weeks.    SIGNATURES/CONFIDENTIALITY: You and/or your care partner have signed paperwork which will be entered into your electronic medical record.  These signatures attest to the fact that that the information above on your After Visit Summary has been reviewed and is understood.  Full responsibility of the confidentiality of this discharge information lies with you and/or your care-partner.

## 2022-02-07 ENCOUNTER — Telehealth: Payer: Self-pay

## 2022-02-07 NOTE — Telephone Encounter (Signed)
  Follow up Call-     02/04/2022   10:54 AM  Call back number  Post procedure Call Back phone  # [phone]  Permission to leave phone message Yes     Post op call attempted, no answer, left WM.

## 2022-02-08 ENCOUNTER — Other Ambulatory Visit: Payer: Self-pay | Admitting: *Deleted

## 2022-02-08 MED ORDER — TRAZODONE HCL 50 MG PO TABS: 50.0000 mg | ORAL_TABLET | Freq: Every day | ORAL | 0 refills | Status: DC

## 2022-02-08 NOTE — Telephone Encounter (Signed)
Rx done. 

## 2022-03-29 NOTE — Progress Notes (Unsigned)
[facility] [address] [place] [address] Phone: [phone] Subjective:   [facility], am serving as a scribe for Dr. [name].  I'm seeing this patient by the request  of:  Kristian Covey, MD  CC: Neck and shoulder pain follow-up  [id number]  11/05/2021 The patient did have hypoechoic changes mostly of the glenohumeral joint.  Patient did not respond as well to the [facility] injection previously given.  Patient did have some improvement in range of motion almost initially.  Discussed icing regimen and home exercises otherwise.  Differential still includes cervical radiculopathy or possible labral pathology.  If continuing to have pain I do think an MR arthrogram of the shoulder would be beneficial.  Hopefully the patient will make good progress and we will recheck again in 6 to 8 weeks  OMT as well  Updated 03/30/2022 Darrell Jordan is a 46 y.o. male coming in with complaint of L shoulder pain, known arthritic changes in the neck that has responded well to osteopathic manipulation.  Last injection in the shoulder was in Nov 05, 2021.  Patient states that his neck has been ok. Shoulder pain anterior, posterior aspect and radiates into bicep. Did have some relief from injection in May for about one week. Improvement was less than 50%. Patient unable to lift arm overhead due to tightness and has sharp pain with sudden movements.        Past Medical History:  Diagnosis Date   Mild intermittent asthma, uncomplicated 11/28/2020   Rheumatoid arthritis (HCC)    Past Surgical History:  Procedure Laterality Date   VASECTOMY  2010   Social History   Socioeconomic History   Marital status: Married    Spouse name: Not on file   Number of children: Not on file   Years of education: Not on file   Highest education level: Not on file  Occupational History   Not on file  Tobacco Use   Smoking status: Former    Types: Cigarettes    Quit date:  2019    Years since quitting: 4.7    Passive exposure: Never   Smokeless tobacco: Never  Vaping Use   Vaping Use: Never used  Substance and Sexual Activity   Alcohol use: Yes    Comment: Rarely   Drug use: Yes    Types: Marijuana    Comment: delta 8 - helps with pain mgmt   Sexual activity: Not on file  Other Topics Concern   Not on file  Social History Narrative   Not on file   Social Determinants of Health   Financial Resource Strain: Not on file  Food Insecurity: Not on file  Transportation Needs: Not on file  Physical Activity: Not on file  Stress: Not on file  Social Connections: Not on file   Allergies  Allergen Reactions   Corn-Containing Products    Eggs Or Egg-Derived Products    Gabapentin    Peanut-Containing Drug Products    Soy Allergy    Sulfasalazine    Wheat Bran    Family History  Problem Relation Age of Onset   Hypotension Mother    Atrial fibrillation Mother    Other Father        joint pain, undiagnosed   Prostate cancer Father 70   Bone cancer Father        mets from prostate cancer   Rheum arthritis Brother 13   Lupus Paternal Aunt  Heart disease Maternal Grandmother        valve replacement   Heart attack Maternal Grandfather 40   Esophageal cancer Paternal Grandmother    Heart disease Paternal Grandmother    Alcohol abuse Paternal Grandfather    Emphysema Paternal Grandfather    Colon polyps Neg Hx    Colon cancer Neg Hx    Stomach cancer Neg Hx    Rectal cancer Neg Hx      Current Outpatient Medications (Cardiovascular):    EPINEPHrine 0.3 mg/0.3 mL IJ SOAJ injection, Inject 0.3 mg into the muscle as needed for anaphylaxis.  Current Outpatient Medications (Respiratory):    albuterol (VENTOLIN HFA) 108 (90 Base) MCG/ACT inhaler, Inhale 2 puffs into the lungs every 6 (six) hours as needed.   Cetirizine HCl (ZYRTEC ALLERGY PO), Take by mouth.   Olopatadine-Mometasone (RYALTRIS) 665-25 MCG/ACT SUSP, Place 1 spray into the nose  2 (two) times daily.    Current Outpatient Medications (Other):    B Complex Vitamins (B COMPLEX PO), Take by mouth daily.   Cholecalciferol (VITAMIN D3 PO), Take by mouth daily.   QUERCETIN PO, Take by mouth daily.   tiZANidine (ZANAFLEX) 2 MG tablet, Take 1 tablet (2 mg total) by mouth at bedtime.   traZODone (DESYREL) 50 MG tablet, Take 1 tablet (50 mg total) by mouth at bedtime.   Reviewed prior external information including notes and imaging from  primary care provider As well as notes that were available from care everywhere and other healthcare systems.  Past medical history, social, surgical and family history all reviewed in electronic medical record.  No pertanent information unless stated regarding to the chief complaint.   Review of Systems:  No headache, visual changes, nausea, vomiting, diarrhea, constipation, dizziness, abdominal pain, skin rash, fevers, chills, night sweats, weight loss, swollen lymph nodes, body aches, joint swelling, chest pain, shortness of breath, mood changes. POSITIVE muscle aches  Objective  There were no vitals taken for this visit.   General: No apparent distress alert and oriented x3 mood and affect normal, dressed appropriately.  HEENT: Pupils equal, extraocular movements intact  Respiratory: Patient's speak in full sentences and does not appear short of breath  Cardiovascular: No lower extremity edema, non tender, no erythema   Osteopathic findings C2 flexed rotated and side bent right C4 flexed rotated and side bent left C6 flexed rotated and side bent left T3 extended rotated and side bent right inhaled third rib T9 extended rotated and side bent left L2 flexed rotated and side bent right Sacrum right on right    Impression and Recommendations:    The above documentation has been reviewed and is accurate and complete Lyndal Pulley, DO

## 2022-03-30 ENCOUNTER — Ambulatory Visit: Payer: Self-pay

## 2022-03-30 ENCOUNTER — Ambulatory Visit: Payer: BC Managed Care – PPO | Admitting: Family Medicine

## 2022-03-30 ENCOUNTER — Encounter: Payer: Self-pay | Admitting: Family Medicine

## 2022-03-30 VITALS — BP 102/72 | HR 74 | Ht 71.0 in

## 2022-03-30 DIAGNOSIS — M25512 Pain in left shoulder: Secondary | ICD-10-CM

## 2022-03-30 DIAGNOSIS — M9902 Segmental and somatic dysfunction of thoracic region: Secondary | ICD-10-CM

## 2022-03-30 DIAGNOSIS — M9904 Segmental and somatic dysfunction of sacral region: Secondary | ICD-10-CM | POA: Diagnosis not present

## 2022-03-30 DIAGNOSIS — M999 Biomechanical lesion, unspecified: Secondary | ICD-10-CM

## 2022-03-30 DIAGNOSIS — M9903 Segmental and somatic dysfunction of lumbar region: Secondary | ICD-10-CM

## 2022-03-30 DIAGNOSIS — M9901 Segmental and somatic dysfunction of cervical region: Secondary | ICD-10-CM

## 2022-03-30 DIAGNOSIS — M9908 Segmental and somatic dysfunction of rib cage: Secondary | ICD-10-CM | POA: Diagnosis not present

## 2022-03-30 DIAGNOSIS — M5030 Other cervical disc degeneration, unspecified cervical region: Secondary | ICD-10-CM

## 2022-03-30 NOTE — Patient Instructions (Addendum)
Injected shoulder and AC joint today Start exercise Monday Do them with electric toothbrush See me in 6 weeks

## 2022-03-31 NOTE — Assessment & Plan Note (Signed)
Degenerative disc disease as well.  An underlying portion can give some difficulty to the neck.  Still avoided HVLA on a regular basis of the neck and do more muscle energy with patient's seronegative rheumatoid arthritis however more muscle tightness noted.  We have discussed the Zanaflex and trazodone again.  Help him with nighttime pain.  Follow-up again in 6 to 8 weeks

## 2022-03-31 NOTE — Assessment & Plan Note (Signed)
Patient given repeat injection again.  Discussed with patient about icing regimen and home exercises.  Chronic problem with exacerbation.  With patient's MRI of the shoulder still concerned that this can be some cervical radiculopathy that could be contributing as well.  Attempted osteopathic manipulation today.  Follow-up again in 6 to 8 weeks.

## 2022-03-31 NOTE — Assessment & Plan Note (Signed)
Hypoechoic changes noted of the acromioclavicular joint again.  Questionable small rheumatoid nodule noted as well.  We will continue to monitor.  E MRI did not show anything that would be surgical.  May need to consider recurrence of formal physical therapy.  Hopefully the patient will respond well to these injections

## 2022-03-31 NOTE — Assessment & Plan Note (Signed)
   Decision today to treat with OMT was based on Physical Exam  After verbal consent patient was treated with HVLA, ME, FPR techniques in cervical, thoracic, rib, lumbar and sacral areas, all areas are chronic, muscle energy only to the cervical region today.  Patient tolerated the procedure well with improvement in symptoms  Patient given exercises, stretches and lifestyle modifications  See medications in patient instructions if given  Patient will follow up in 4-8 weeks

## 2022-04-10 ENCOUNTER — Encounter: Payer: Self-pay | Admitting: Family Medicine

## 2022-04-12 ENCOUNTER — Ambulatory Visit: Payer: BC Managed Care – PPO | Admitting: Family Medicine

## 2022-04-13 ENCOUNTER — Other Ambulatory Visit: Payer: Self-pay

## 2022-04-13 MED ORDER — CELECOXIB 200 MG PO CAPS: 200.0000 mg | ORAL_CAPSULE | Freq: Two times a day (BID) | ORAL | 0 refills | Status: DC

## 2022-05-08 ENCOUNTER — Other Ambulatory Visit: Payer: Self-pay | Admitting: Family Medicine

## 2022-05-09 NOTE — Telephone Encounter (Signed)
Pt is due soon for a CPE

## 2022-05-11 ENCOUNTER — Ambulatory Visit: Payer: BC Managed Care – PPO | Admitting: Family Medicine

## 2022-05-12 NOTE — Progress Notes (Unsigned)
[name] [address] [place] [address] Phone: [phone] Subjective:   [facility], am serving as a scribe for Dr. [name]. I'm seeing this patient by the request  of:  Kristian Covey, MD  CC: Back and neck pain follow-up  [id number]  Darrell Jordan is a 46 y.o. male coming in with complaint of back and neck pain. OMT 04/10/2022. Also f/u for R shoulder pain. Patient states that he is improving but is not happy with where he is at now. Can have intense pain in L anterior shoulder at time. Pain can radiate into posterior shoulder. Took celebrex for 2 weeks but did not like side effects.   Medications patient has been prescribed: Celebrex  Taking: Initially took it but did discontinue secondary to it starting to hurt his stomach         Reviewed prior external information including notes and imaging from previsou exam, outside providers and external EMR if available.   As well as notes that were available from care everywhere and other healthcare systems.  Past medical history, social, surgical and family history all reviewed in electronic medical record.  No pertanent information unless stated regarding to the chief complaint.   Past Medical History:  Diagnosis Date   Mild intermittent asthma, uncomplicated 11/28/2020   Rheumatoid arthritis (HCC)     Allergies  Allergen Reactions   Corn-Containing Products    Eggs Or Egg-Derived Products    Gabapentin    Peanut-Containing Drug Products    Soy Allergy    Sulfasalazine    Wheat Bran      Review of Systems:  No headache, visual changes, nausea, vomiting, diarrhea, constipation, dizziness, abdominal pain, skin rash, fevers, chills, night sweats, weight loss, swollen lymph nodes, body aches, joint swelling, chest pain, shortness of breath, mood changes. POSITIVE muscle aches  Objective  Blood pressure 110/82, pulse 60, height 5\' 11"  (1.803 m), SpO2 97 %.   General:  No apparent distress alert and oriented x3 mood and affect normal, dressed appropriately.  HEENT: Pupils equal, extraocular movements intact  Respiratory: Patient's speak in full sentences and does not appear short of breath  Cardiovascular: No lower extremity edema, non tender, no erythema  Continues having tightness noted on the left side of the paraspinal musculature.  Seems to be more in the parascapular area.  Does have some impingement with the left shoulder still noted, positive O'Brien's.  Osteopathic findings  C2 flexed rotated and side bent right C7 flexed rotated and side bent left T3 extended rotated and side bent left inhaled rib T9 extended rotated and side bent left inhaled. L2 flexed rotated and side bent right Sacrum right on right       Assessment and Plan:  Left shoulder pain Discussed at great length.  Discussed the potential for either PRP.  Has not responded to the steroids as well.  Patient is saving up for this.  Ongoing contact when he would like to do this.  MRI did not show anything that would be surgical.  Discussed with this at great length.  Degenerative cervical disc Chronic problem with worsening symptoms.  Discussed the Celebrex 200 mg twice a day as needed and hurting his stomach.  Discussed potentially changing to a different anti-inflammatory.  Gabapentin 100 mg tablets but patient has had some side effects to this medicine and is listed as an allergy..  Follow-up with me again in 6 to 8 weeks  Nonallopathic problems  Decision today to treat with OMT was based on Physical Exam  After verbal consent patient was treated with HVLA, ME, FPR techniques in cervical, rib, thoracic, lumbar, and sacral  areas  Patient tolerated the procedure well with improvement in symptoms  Patient given exercises, stretches and lifestyle modifications  See medications in patient instructions if given  Patient will follow up in 4-8 weeks     The above  documentation has been reviewed and is accurate and complete Judi Saa, DO         Note: This dictation was prepared with Dragon dictation along with smaller phrase technology. Any transcriptional errors that result from this process are unintentional.

## 2022-05-16 ENCOUNTER — Ambulatory Visit: Payer: BC Managed Care – PPO | Admitting: Family Medicine

## 2022-05-16 ENCOUNTER — Encounter: Payer: Self-pay | Admitting: Family Medicine

## 2022-05-16 ENCOUNTER — Ambulatory Visit: Payer: Self-pay

## 2022-05-16 VITALS — BP 110/82 | HR 60 | Ht 71.0 in

## 2022-05-16 DIAGNOSIS — M25512 Pain in left shoulder: Secondary | ICD-10-CM | POA: Diagnosis not present

## 2022-05-16 DIAGNOSIS — M9904 Segmental and somatic dysfunction of sacral region: Secondary | ICD-10-CM

## 2022-05-16 DIAGNOSIS — M9902 Segmental and somatic dysfunction of thoracic region: Secondary | ICD-10-CM

## 2022-05-16 DIAGNOSIS — M9908 Segmental and somatic dysfunction of rib cage: Secondary | ICD-10-CM

## 2022-05-16 DIAGNOSIS — M9903 Segmental and somatic dysfunction of lumbar region: Secondary | ICD-10-CM

## 2022-05-16 DIAGNOSIS — M5030 Other cervical disc degeneration, unspecified cervical region: Secondary | ICD-10-CM | POA: Diagnosis not present

## 2022-05-16 DIAGNOSIS — M9901 Segmental and somatic dysfunction of cervical region: Secondary | ICD-10-CM

## 2022-05-16 NOTE — Assessment & Plan Note (Signed)
Chronic problem with worsening symptoms.  Discussed the Celebrex 200 mg twice a day as needed and hurting his stomach.  Discussed potentially changing to a different anti-inflammatory.  Gabapentin 100 mg tablets but patient has had some side effects to this medicine and is listed as an allergy..  Follow-up with me again in 6 to 8 weeks

## 2022-05-16 NOTE — Assessment & Plan Note (Signed)
Discussed at great length.  Discussed the potential for either PRP.  Has not responded to the steroids as well.  Patient is saving up for this.  Ongoing contact us when he would like to do this.  MRI did not show anything that would be surgical.  Discussed with this at great length.

## 2022-05-16 NOTE — Patient Instructions (Signed)
Good to see you Read about PRP See me again in 8 weeks

## 2022-05-24 ENCOUNTER — Ambulatory Visit: Payer: BC Managed Care – PPO | Admitting: Allergy & Immunology

## 2022-06-17 ENCOUNTER — Encounter: Payer: Self-pay | Admitting: Family Medicine

## 2022-06-23 ENCOUNTER — Ambulatory Visit (INDEPENDENT_AMBULATORY_CARE_PROVIDER_SITE_OTHER): Payer: Self-pay | Admitting: Family Medicine

## 2022-06-23 ENCOUNTER — Encounter: Payer: Self-pay | Admitting: Family Medicine

## 2022-06-23 ENCOUNTER — Ambulatory Visit: Payer: Self-pay

## 2022-06-23 VITALS — BP 106/66 | HR 67 | Ht 71.0 in | Wt 195.0 lb

## 2022-06-23 DIAGNOSIS — G8929 Other chronic pain: Secondary | ICD-10-CM

## 2022-06-23 DIAGNOSIS — M25512 Pain in left shoulder: Secondary | ICD-10-CM

## 2022-06-23 NOTE — Patient Instructions (Signed)
No ice or IBU for 3 days Heat and Tylenol are ok See me again in 6 weeks 

## 2022-06-23 NOTE — Assessment & Plan Note (Signed)
Found to have an abnormality of the labral area and does have some mild tendinosis noted with some interstitial possible tearing.  Discussed with patient icing regimen and home exercises, discussed which activities to do and which ones to avoid.  Follow-up again in 6 to 8 weeks.

## 2022-06-23 NOTE — Progress Notes (Signed)
  [name] [address] [place] [address] Phone: [phone] Subjective:   [name], am serving as a scribe for Dr. [name].  I'm seeing this patient by the request  of:  Kristian Covey, MD  CC: Left shoulder pain  [id number]  [facility] is a 46 y.o. male coming in with complaint of left shoulder pain here for PRP. Pain is intermittent but can be sharp at times.        Past Medical History:  Diagnosis Date   Mild intermittent asthma, uncomplicated 11/28/2020   Rheumatoid arthritis (HCC)    Past Surgical History:  Procedure Laterality Date   VASECTOMY  2010   Social History   Socioeconomic History   Marital status: Married    Spouse name: Not on file   Number of children: Not on file   Years of education: Not on file   Highest education level: Not on file  Occupational History   Not on file  Tobacco Use   Smoking status: Former    Types: Cigarettes    Quit date: 2019    Years since quitting: 4.9    Passive exposure: Never   Smokeless tobacco: Never  Vaping Use   Vaping Use: Never used  Substance and Sexual Activity   Alcohol use: Yes    Comment: Rarely   Drug use: Yes    Types: Marijuana    Comment: delta 8 - helps with pain mgmt   Sexual activity: Not on file  Other Topics Concern   Not on file  Social History Narrative   Not on file   Social Determinants of Health   Financial Resource Strain: Not on file  Food Insecurity: Not on file  Transportation Needs: Not on file  Physical Activity: Not on file  Stress: Not on file  Social Connections: Not on file   Allergies  Allergen Reactions   Corn-Containing Products    Eggs Or Egg-Derived Products    Gabapentin    Peanut-Containing Drug Products    Soy Allergy    Sulfasalazine    Wheat Bran     Objective  Blood pressure 106/66, pulse 67, height 5\' 11"  (1.803 m), weight 195 lb (88.5 kg), SpO2 99 %.   General: No apparent distress  alert and oriented x3 mood and affect normal, dressed appropriately.   Procedure: Real-time Ultrasound Guided Injection of left glenohumeral joint Device: GE Logiq E  Ultrasound guided injection is preferred based studies that show increased duration, increased effect, greater accuracy, decreased procedural pain, increased response rate with ultrasound guided versus blind injection.  Verbal informed consent obtained.  Time-out conducted.  Noted no overlying erythema, induration, or other signs of local infection.  Skin prepped in a sterile fashion.  Local anesthesia: Topical Ethyl chloride.  With sterile technique and under real time ultrasound guidance:  Joint visualized.  21g 2 inch needle inserted posterior approach. Pictures taken for needle placement. Patient did have injection of 2 cc of 0.5% Marcaine, and injected with 4 cc of PRP Completed without difficulty .  Advised to call if fevers/chills, erythema, induration, drainage, or persistent bleeding.  Impression: Technically successful ultrasound guided injection.    Impression and Recommendations:    The above documentation has been reviewed and is accurate and complete , DO

## 2022-07-12 NOTE — Progress Notes (Signed)
[place] Phone: [phone] Subjective:    I'm seeing this patient by the request  of:  Eulas Post, MD  CC: left shoulder pain and neck pain follow up   PNT:[id number]  06/23/2022 Found to have an abnormality of the labral area and does have some mild tendinosis noted with some interstitial possible tearing.  Discussed with patient icing regimen and home exercises, discussed which activities to do and which ones to avoid.  Follow-up again in 6 to 8 weeks.     Update 07/14/2022 Khaza Blansett is a 47 y.o. male coming in with complaint of L shoulder pain. PRP last visit. Patient states that it is doing better not 100% but is better.        Past Medical History:  Diagnosis Date   Mild intermittent asthma, uncomplicated 01/30/7618   Rheumatoid arthritis ([place])    Past Surgical History:  Procedure Laterality Date   VASECTOMY  2010   Social History   Socioeconomic History   Marital status: Married    Spouse name: Not on file   Number of children: Not on file   Years of education: Not on file   Highest education level: Not on file  Occupational History   Not on file  Tobacco Use   Smoking status: Former    Types: Cigarettes    Quit date: 2019    Years since quitting: 5.0    Passive exposure: Never   Smokeless tobacco: Never  Vaping Use   Vaping Use: Never used  Substance and Sexual Activity   Alcohol use: Yes    Comment: Rarely   Drug use: Yes    Types: Marijuana    Comment: delta 8 - helps with pain mgmt   Sexual activity: Not on file  Other Topics Concern   Not on file  Social History Narrative   Not on file   Social Determinants of Health   Financial Resource Strain: Not on file  Food Insecurity: Not on file  Transportation Needs: Not on file  Physical Activity: Not on file  Stress: Not on file  Social Connections: Not on file   Allergies  Allergen Reactions   Corn-Containing  Products    Eggs Or Egg-Derived Products    Gabapentin    Peanut-Containing Drug Products    Soy Allergy    Sulfasalazine    Wheat Bran    Family History  Problem Relation Age of Onset   Hypotension Mother    Atrial fibrillation Mother    Other Father        joint pain, undiagnosed   Prostate cancer Father 70   Bone cancer Father        mets from prostate cancer   Rheum arthritis Brother 6   Lupus Paternal Aunt    Heart disease Maternal Grandmother        valve replacement   Heart attack Maternal Grandfather 4   Esophageal cancer Paternal Grandmother    Heart disease Paternal Grandmother    Alcohol abuse Paternal Grandfather    Emphysema Paternal Grandfather    Colon polyps Neg Hx    Colon cancer Neg Hx    Stomach cancer Neg Hx    Rectal cancer Neg Hx      Current Outpatient Medications (Cardiovascular):    EPINEPHrine 0.3 mg/0.3 mL IJ SOAJ injection, Inject 0.3 mg into the muscle as needed for anaphylaxis.  Current Outpatient Medications (Respiratory):  albuterol (VENTOLIN HFA) 108 (90 Base) MCG/ACT inhaler, Inhale 2 puffs into the lungs every 6 (six) hours as needed.   Cetirizine HCl (ZYRTEC ALLERGY PO), Take by mouth.   Olopatadine-Mometasone (RYALTRIS) 665-25 MCG/ACT SUSP, Place 1 spray into the nose 2 (two) times daily.  Current Outpatient Medications (Analgesics):    celecoxib (CELEBREX) 200 MG capsule, Take 1 capsule (200 mg total) by mouth 2 (two) times daily.   Current Outpatient Medications (Other):    B Complex Vitamins (B COMPLEX PO), Take by mouth daily.   Cholecalciferol (VITAMIN D3 PO), Take by mouth daily.   QUERCETIN PO, Take by mouth daily.   tiZANidine (ZANAFLEX) 2 MG tablet, Take 1 tablet (2 mg total) by mouth at bedtime.   traZODone (DESYREL) 50 MG tablet, TAKE 1 TABLET BY MOUTH EVERYDAY AT BEDTIME   Reviewed prior external information including notes and imaging from  primary care provider As well as notes that were available from care  everywhere and other healthcare systems.  Past medical history, social, surgical and family history all reviewed in electronic medical record.  No pertanent information unless stated regarding to the chief complaint.   Review of Systems:  No headache, visual changes, nausea, vomiting, diarrhea, constipation, dizziness, abdominal pain, skin rash, fevers, chills, night sweats, weight loss, swollen lymph nodes, body aches, joint swelling, chest pain, shortness of breath, mood changes. POSITIVE muscle aches  Objective  There were no vitals taken for this visit.   General: No apparent distress alert and oriented x3 mood and affect normal, dressed appropriately.  HEENT: Pupils equal, extraocular movements intact  Respiratory: Patient's speak in full sentences and does not appear short of breath  Cardiovascular: No lower extremity edema, non tender, no erythema  Shoulder exam shows patient does have some mild impingement sign still noted.  Patient though did show that there is significant improvement in the active range of motion from previous exam.  Neck exam shows patient does have some limited sidebending bilaterally.  Some tenderness to palpation in the paraspinal musculature.  Tightness noted in the parascapular area left greater than right     Osteopathic findings  C2 flexed rotated and side bent right C4 flexed rotated and side bent left C6 flexed rotated and side bent left T3 extended rotated and side bent left inhaled third rib L2 flexed rotated and side bent right Sacrum right on right    Impression and Recommendations:     The above documentation has been reviewed and is accurate and complete Lyndal Pulley, DO

## 2022-07-14 ENCOUNTER — Ambulatory Visit: Payer: BC Managed Care – PPO | Admitting: Family Medicine

## 2022-07-14 ENCOUNTER — Ambulatory Visit: Payer: Self-pay

## 2022-07-14 VITALS — BP 122/80 | HR 66 | Ht 71.0 in | Wt 195.0 lb

## 2022-07-14 DIAGNOSIS — M9903 Segmental and somatic dysfunction of lumbar region: Secondary | ICD-10-CM | POA: Diagnosis not present

## 2022-07-14 DIAGNOSIS — M5030 Other cervical disc degeneration, unspecified cervical region: Secondary | ICD-10-CM | POA: Diagnosis not present

## 2022-07-14 DIAGNOSIS — M25512 Pain in left shoulder: Secondary | ICD-10-CM

## 2022-07-14 DIAGNOSIS — M9901 Segmental and somatic dysfunction of cervical region: Secondary | ICD-10-CM | POA: Diagnosis not present

## 2022-07-14 DIAGNOSIS — G8929 Other chronic pain: Secondary | ICD-10-CM

## 2022-07-14 DIAGNOSIS — M9908 Segmental and somatic dysfunction of rib cage: Secondary | ICD-10-CM | POA: Diagnosis not present

## 2022-07-14 DIAGNOSIS — M9902 Segmental and somatic dysfunction of thoracic region: Secondary | ICD-10-CM | POA: Diagnosis not present

## 2022-07-14 DIAGNOSIS — M9904 Segmental and somatic dysfunction of sacral region: Secondary | ICD-10-CM | POA: Diagnosis not present

## 2022-07-14 NOTE — Patient Instructions (Addendum)
Good to see you  Shoulder is looking like it is doing great Follow up in 5-6 weeks

## 2022-07-14 NOTE — Assessment & Plan Note (Signed)
Patient is doing relatively well at this time.  Discussed with patient about icing regimen and home exercises.  Patient is continuing to stay active where possible.  Follow-up with me again in 6 to 8 weeks.

## 2022-08-05 ENCOUNTER — Telehealth: Payer: Self-pay | Admitting: Family Medicine

## 2022-08-05 NOTE — Telephone Encounter (Signed)
Pt is wanting to transfer his care from Dr Elease Hashimoto to Dr. Grandville Silos, he lives closer to [place] at [place]. Just let me know and I can give him a call at [phone]

## 2022-08-08 NOTE — Telephone Encounter (Signed)
He has scheduled himself a NP app via mychart. Let me know if this is OK.

## 2022-08-09 ENCOUNTER — Other Ambulatory Visit: Payer: Self-pay | Admitting: Family Medicine

## 2022-08-16 ENCOUNTER — Ambulatory Visit: Payer: BC Managed Care – PPO | Admitting: Family Medicine

## 2022-08-17 NOTE — Progress Notes (Unsigned)
Darrell Jordan [address] [place] Phone: [phone] Subjective:   Darrell Jordan, am serving as a scribe for Dr. Hulan Saas.  I'm seeing this patient by the request  of:  Eulas Post, MD  CC: Back and neck pain follow-up  [id number]  Darrell Jordan is a 47 y.o. male coming in with complaint of back and neck pain. OMT 07/14/2022. Patient states shoulder is still tight. No other concerns.  Medications patient has been prescribed: None  Taking:         Reviewed prior external information including notes and imaging from previsou exam, outside providers and external EMR if available.   As well as notes that were available from care everywhere and other healthcare systems.  Past medical history, social, surgical and family history all reviewed in electronic medical record.  No pertanent information unless stated regarding to the chief complaint.   Past Medical History:  Diagnosis Date   Mild intermittent asthma, uncomplicated 123456   Rheumatoid arthritis ([facility])     Allergies  Allergen Reactions   Corn-Containing Products    Eggs Or Egg-Derived Products    Gabapentin    Peanut-Containing Drug Products    Soy Allergy    Sulfasalazine    Wheat Bran      Review of Systems:  No headache, visual changes, nausea, vomiting, diarrhea, constipation, dizziness, abdominal pain, skin rash, fevers, chills, night sweats, weight loss, swollen lymph nodes, body aches, joint swelling, chest pain, shortness of breath, mood changes. POSITIVE muscle aches  Objective  Blood pressure 116/72, pulse 73, height 5' 11"$  (1.803 m), weight 199 lb (90.3 kg), SpO2 98 %.   General: No apparent distress alert and oriented x3 mood and affect normal, dressed appropriately.  HEENT: Pupils equal, extraocular movements intact  Respiratory: Patient's speak in full sentences and does not appear short of breath  Cardiovascular: No lower extremity  edema, non tender, no erythema  Left shoulder exam does have some tenderness to palpation noted.  Patient does have some positive impingement still noted.  Osteopathic findings  C2 flexed rotated and side bent left  C6 flexed rotated and side bent left T3 extended rotated and side bent right inhaled rib T6 extended rotated and side bent left inhaled rib  L1 flexed rotated and side bent right Sacrum right on right       Assessment and Plan:  Left shoulder pain Discussed with patient again at great length.  We discussed icing regimen and home exercises, patient still has some tenderness noted.  Discussed we could repeat imaging again in the near future if needed.  Follow-up again in 6 to 8 weeks  Degenerative cervical disc Significant tightness of the neck on the left side as noted.  The patient did respond extremely well to osteopathic manipulation today.  Will monitor for any type of radicular symptoms which he has had in the past.  Discussed icing regimen and home exercises.  Increase activity slowly.  Follow-up again in 6 to 8 weeks.  Seronegative rheumatoid arthritis ([place]) Discussed with patient again at great length about the seronegative rheumatoid arthritis.  Patient is wondering if there is any other treatment options such as PRP or whole blood treatment for his body.  Will look into this but do not know specifically    Nonallopathic problems  Decision today to treat with OMT was based on Physical Exam  After verbal consent patient was treated with HVLA, ME, FPR techniques in cervical,  rib, thoracic, lumbar, and sacral  areas  Patient tolerated the procedure well with improvement in symptoms  Patient given exercises, stretches and lifestyle modifications  See medications in patient instructions if given  Patient will follow up in 4-8 weeks    The above documentation has been reviewed and is accurate and complete Lyndal Pulley, DO          Note: This  dictation was prepared with Dragon dictation along with smaller phrase technology. Any transcriptional errors that result from this process are unintentional.

## 2022-08-18 ENCOUNTER — Encounter: Payer: Self-pay | Admitting: Family Medicine

## 2022-08-18 ENCOUNTER — Ambulatory Visit: Payer: BC Managed Care – PPO | Admitting: Family Medicine

## 2022-08-18 VITALS — BP 116/72 | HR 73 | Ht 71.0 in | Wt 199.0 lb

## 2022-08-18 DIAGNOSIS — M9902 Segmental and somatic dysfunction of thoracic region: Secondary | ICD-10-CM

## 2022-08-18 DIAGNOSIS — M25512 Pain in left shoulder: Secondary | ICD-10-CM | POA: Diagnosis not present

## 2022-08-18 DIAGNOSIS — M5030 Other cervical disc degeneration, unspecified cervical region: Secondary | ICD-10-CM | POA: Diagnosis not present

## 2022-08-18 DIAGNOSIS — M0600 Rheumatoid arthritis without rheumatoid factor, unspecified site: Secondary | ICD-10-CM | POA: Diagnosis not present

## 2022-08-18 DIAGNOSIS — M9901 Segmental and somatic dysfunction of cervical region: Secondary | ICD-10-CM

## 2022-08-18 DIAGNOSIS — M9904 Segmental and somatic dysfunction of sacral region: Secondary | ICD-10-CM | POA: Diagnosis not present

## 2022-08-18 DIAGNOSIS — M9908 Segmental and somatic dysfunction of rib cage: Secondary | ICD-10-CM

## 2022-08-18 DIAGNOSIS — M9903 Segmental and somatic dysfunction of lumbar region: Secondary | ICD-10-CM

## 2022-08-18 NOTE — Assessment & Plan Note (Signed)
Significant tightness of the neck on the left side as noted.  The patient did respond extremely well to osteopathic manipulation today.  Will monitor for any type of radicular symptoms which he has had in the past.  Discussed icing regimen and home exercises.  Increase activity slowly.  Follow-up again in 6 to 8 weeks.

## 2022-08-18 NOTE — Assessment & Plan Note (Signed)
Discussed with patient again at great length about the seronegative rheumatoid arthritis.  Patient is wondering if there is any other treatment options such as PRP or whole blood treatment for his body.  Will look into this but do not know specifically

## 2022-08-18 NOTE — Patient Instructions (Addendum)
Good to see you! Will look into PRP info Keep hands in peripheral vision but increase activity See you again in 5-6 weeks

## 2022-08-18 NOTE — Assessment & Plan Note (Signed)
Discussed with patient again at great length.  We discussed icing regimen and home exercises, patient still has some tenderness noted.  Discussed we could repeat imaging again in the near future if needed.  Follow-up again in 6 to 8 weeks

## 2022-08-23 ENCOUNTER — Ambulatory Visit: Payer: BC Managed Care – PPO | Admitting: Family Medicine

## 2022-08-26 ENCOUNTER — Ambulatory Visit: Payer: BC Managed Care – PPO | Admitting: Family Medicine

## 2022-08-26 ENCOUNTER — Encounter: Payer: Self-pay | Admitting: Family Medicine

## 2022-08-26 VITALS — BP 122/80 | HR 79 | Temp 97.6°F | Ht 71.5 in | Wt 199.2 lb

## 2022-08-26 DIAGNOSIS — Z8042 Family history of malignant neoplasm of prostate: Secondary | ICD-10-CM | POA: Diagnosis not present

## 2022-08-26 DIAGNOSIS — R5382 Chronic fatigue, unspecified: Secondary | ICD-10-CM

## 2022-08-26 DIAGNOSIS — F17290 Nicotine dependence, other tobacco product, uncomplicated: Secondary | ICD-10-CM

## 2022-08-26 DIAGNOSIS — J302 Other seasonal allergic rhinitis: Secondary | ICD-10-CM

## 2022-08-26 DIAGNOSIS — J4520 Mild intermittent asthma, uncomplicated: Secondary | ICD-10-CM | POA: Diagnosis not present

## 2022-08-26 DIAGNOSIS — M0600 Rheumatoid arthritis without rheumatoid factor, unspecified site: Secondary | ICD-10-CM

## 2022-08-26 DIAGNOSIS — E559 Vitamin D deficiency, unspecified: Secondary | ICD-10-CM

## 2022-08-26 LAB — COMPREHENSIVE METABOLIC PANEL
ALT: 28 U/L (ref 0–53)
AST: 21 U/L (ref 0–37)
Albumin: 4.5 g/dL (ref 3.5–5.2)
Alkaline Phosphatase: 64 U/L (ref 39–117)
BUN: 20 mg/dL (ref 6–23)
CO2: 25 mEq/L (ref 19–32)
Calcium: 10 mg/dL (ref 8.4–10.5)
Chloride: 103 mEq/L (ref 96–112)
Creatinine, Ser: 1.16 mg/dL (ref 0.40–1.50)
GFR: 75.06 mL/min (ref >60.00)
Glucose, Bld: 75 mg/dL (ref 70–99)
Potassium: 5 mEq/L (ref 3.5–5.1)
Sodium: 144 mEq/L (ref 135–145)
Total Bilirubin: 0.6 mg/dL (ref 0.2–1.2)
Total Protein: 6.9 g/dL (ref 6.0–8.3)

## 2022-08-26 LAB — CBC
HCT: 47.1 % (ref 39.0–52.0)
Hemoglobin: 15.9 g/dL (ref 13.0–17.0)
MCHC: 33.7 g/dL (ref 30.0–36.0)
MCV: 94.8 fl (ref 78.0–100.0)
Platelets: 144 10*3/uL — ABNORMAL LOW (ref 150.0–400.0)
RBC: 4.97 Mil/uL (ref 4.22–5.81)
RDW: 12.4 % (ref 11.5–15.5)
WBC: 5.8 10*3/uL (ref 4.0–10.5)

## 2022-08-26 LAB — B12 AND FOLATE PANEL
Folate: 14.5 ng/mL (ref >5.9)
Vitamin B-12: 413 pg/mL (ref 211–911)

## 2022-08-26 LAB — TSH: TSH: 1.32 u[IU]/mL (ref 0.35–5.50)

## 2022-08-26 LAB — VITAMIN D 25 HYDROXY (VIT D DEFICIENCY, FRACTURES): VITD: 13.5 ng/mL — ABNORMAL LOW (ref 30.00–100.00)

## 2022-08-26 LAB — VITAMIN B12: Vitamin B-12: 413 pg/mL (ref 211–911)

## 2022-08-26 LAB — PSA: PSA: 0.4 ng/mL (ref 0.10–4.00)

## 2022-08-26 LAB — HEMOGLOBIN A1C: Hgb A1c MFr Bld: 5.3 % (ref 4.6–6.5)

## 2022-08-26 LAB — SEDIMENTATION RATE: Sed Rate: 15 mm/hr (ref 0–15)

## 2022-08-26 LAB — C-REACTIVE PROTEIN: CRP: <1 mg/dL (ref 0.5–20.0)

## 2022-08-26 MED ORDER — FLUTICASONE PROPIONATE 50 MCG/ACT NA SUSP: 2.0000 | Freq: Every day | NASAL | 6 refills | Status: AC

## 2022-08-26 MED ORDER — TRAZODONE HCL 50 MG PO TABS: 50.0000 mg | ORAL_TABLET | Freq: Every day | ORAL | 1 refills | Status: DC

## 2022-08-26 MED ORDER — AZELASTINE HCL 0.1 % NA SOLN: 2.0000 | Freq: Two times a day (BID) | NASAL | 12 refills | Status: AC

## 2022-08-26 MED ORDER — MONTELUKAST SODIUM 10 MG PO TABS: 10.0000 mg | ORAL_TABLET | Freq: Every day | ORAL | 3 refills | Status: DC

## 2022-08-26 NOTE — Patient Instructions (Addendum)
For fatigue we are getting labs and referring to sleep study.  We are also getting PSA, refilling your trazodone  For allergies, we are starting Singulair as well as azelastine and fluticasone nasal sprays.  Please follow-up if no improvement in fatigue, worsening symptoms, or any abnormalities on Labs.  Otherwise, please follow-up in 6 months

## 2022-08-26 NOTE — Progress Notes (Unsigned)
Assessment/Plan:   Problem List Items Addressed This Visit   None   Medications Discontinued During This Encounter  Medication Reason   Olopatadine-Mometasone (RYALTRIS) [phone] MCG/ACT SUSP    celecoxib (CELEBREX) 200 MG capsule       Subjective:  HPI: Encounter date: 08/26/2022  Darrell Jordan is a 47 y.o. male who has Degenerative cervical disc; Nonallopathic lesion of cervical region; Nonallopathic lesion of thoracic region; Nonallopathic lesion of rib cage; Seronegative rheumatoid arthritis ([place]); Dyshidrotic eczema; Chronic rhinitis; Mild intermittent asthma, uncomplicated; Anaphylactic shock due to adverse food reaction; Adjustment insomnia; Depressive disorder; Hyperinsulinism; Recurrent major depressive disorder, in full remission ([place]); Nausea; Left shoulder pain; Piriformis syndrome of both sides; Pain in right hip; Pain in left acromioclavicular joint; Hyperlipidemia; Encounter for medication monitoring; Screening PSA (prostate specific antigen); and Seasonal allergies on their problem list..   He  has a past medical history of Allergy (1980), Depression (1990), Mild intermittent asthma, uncomplicated (99991111), and Rheumatoid arthritis ([place]).Marland Kitchen   He presents with chief complaint of Establish Care (Rx refill trazodone. Fasting) .   Past Surgical History:  Procedure Laterality Date   VASECTOMY  2010    Outpatient Medications Prior to Visit  Medication Sig Dispense Refill   albuterol (VENTOLIN HFA) 108 (90 Base) MCG/ACT inhaler Inhale 2 puffs into the lungs every 6 (six) hours as needed. 18 g 2   B Complex Vitamins (B COMPLEX PO) Take by mouth daily.     Cetirizine HCl (ZYRTEC ALLERGY PO) Take by mouth.     Cholecalciferol (VITAMIN D3 PO) Take by mouth daily.     EPINEPHrine 0.3 mg/0.3 mL IJ SOAJ injection Inject 0.3 mg into the muscle as needed for anaphylaxis. 2 each 1   QUERCETIN PO Take by mouth daily.     tiZANidine (ZANAFLEX) 2 MG tablet Take 1 tablet (2 mg  total) by mouth at bedtime. 30 tablet 0   traZODone (DESYREL) 50 MG tablet TAKE 1 TABLET BY MOUTH EVERYDAY AT BEDTIME 90 tablet 0   celecoxib (CELEBREX) 200 MG capsule Take 1 capsule (200 mg total) by mouth 2 (two) times daily. 60 capsule 0   Olopatadine-Mometasone (RYALTRIS) 665-25 MCG/ACT SUSP Place 1 spray into the nose 2 (two) times daily. 29 g 5   No facility-administered medications prior to visit.    Family History  Problem Relation Age of Onset   Hypotension Mother    Atrial fibrillation Mother    Other Father        joint pain, undiagnosed   Prostate cancer Father 44   Bone cancer Father        mets from prostate cancer   Arthritis Father    Cancer Father    Rheum arthritis Brother 25   Lupus Paternal Aunt    Heart disease Maternal Grandmother        valve replacement   Heart attack Maternal Grandfather 40   Esophageal cancer Paternal Grandmother    Heart disease Paternal Grandmother    Alcohol abuse Paternal Grandfather    Emphysema Paternal Grandfather    Colon polyps Neg Hx    Colon cancer Neg Hx    Stomach cancer Neg Hx    Rectal cancer Neg Hx     Social History   Socioeconomic History   Marital status: Married    Spouse name: Not on file   Number of children: Not on file   Years of education: Not on file   Highest education level: Not on file  Occupational History  Not on file  Tobacco Use   Smoking status: Former    Types: Cigarettes    Quit date: 06/27/2017    Years since quitting: 5.1    Passive exposure: Never   Smokeless tobacco: Never  Vaping Use   Vaping Use: Never used  Substance and Sexual Activity   Alcohol use: Not Currently    Comment: Rarely   Drug use: Yes    Types: Marijuana    Comment: delta 8 - helps with pain mgmt   Sexual activity: Yes    Birth control/protection: Surgical  Other Topics Concern   Not on file  Social History Narrative   Not on file   Social Determinants of Health   Financial Resource Strain: Not on  file  Food Insecurity: Not on file  Transportation Needs: Not on file  Physical Activity: Not on file  Stress: Not on file  Social Connections: Not on file  Intimate Partner Violence: Not on file                                                                                                 Objective:  Physical Exam: BP 122/80 (BP Location: Left Arm, Patient Position: Sitting, Cuff Size: Large)   Pulse 79   Temp 97.6 F (36.4 C) (Temporal)   Ht 5' 11.5" (1.816 m)   Wt 199 lb 3.2 oz (90.4 kg)   SpO2 98%   BMI 27.40 kg/m    ***General: No acute distress. Awake and conversant.  Eyes: Normal conjunctiva, anicteric. Round symmetric pupils.  ENT: Hearing grossly intact. No nasal discharge.  Neck: Neck is supple. No masses or thyromegaly.  Respiratory: Respirations are non-labored. No auditory wheezing.  Skin: Warm. No rashes or ulcers.  Psych: Alert and oriented. Cooperative, Appropriate mood and affect, Normal judgment.  CV: No cyanosis or JVD MSK: Normal ambulation. No clubbing  Neuro: Sensation and CN II-XII grossly normal.   Physical Exam       Alesia Banda, MD, MS

## 2022-08-27 LAB — URINALYSIS W MICROSCOPIC + REFLEX CULTURE
Bacteria, UA: NONE SEEN /HPF
Bilirubin Urine: NEGATIVE
Glucose, UA: NEGATIVE
Hgb urine dipstick: NEGATIVE
Hyaline Cast: NONE SEEN /LPF
Ketones, ur: NEGATIVE
Leukocyte Esterase: NEGATIVE
Nitrites, Initial: NEGATIVE
Protein, ur: NEGATIVE
Specific Gravity, Urine: 1.026 (ref 1.001–1.035)
Squamous Epithelial / HPF: NONE SEEN /HPF (ref <=5)
WBC, UA: NONE SEEN /HPF (ref 0–5)
pH: 7.5 (ref 5.0–8.0)

## 2022-08-27 LAB — IRON,TIBC AND FERRITIN PANEL
%SAT: 21 % (calc) (ref 20–48)
Ferritin: 62 ng/mL (ref 38–380)
Iron: 72 ug/dL (ref 50–180)
TIBC: 348 mcg/dL (calc) (ref 250–425)

## 2022-08-27 LAB — NO CULTURE INDICATED

## 2022-08-28 DIAGNOSIS — R5382 Chronic fatigue, unspecified: Secondary | ICD-10-CM | POA: Insufficient documentation

## 2022-08-28 DIAGNOSIS — Z8042 Family history of malignant neoplasm of prostate: Secondary | ICD-10-CM | POA: Insufficient documentation

## 2022-08-28 DIAGNOSIS — F17200 Nicotine dependence, unspecified, uncomplicated: Secondary | ICD-10-CM | POA: Insufficient documentation

## 2022-08-28 MED ORDER — VITAMIN D (ERGOCALCIFEROL) 1.25 MG (50000 UNIT) PO CAPS: 50000.0000 [IU] | ORAL_CAPSULE | ORAL | 0 refills | Status: DC

## 2022-08-28 NOTE — Assessment & Plan Note (Signed)
Monitoring of disease progression through blood work and symptom report. No changes to the current treatment regimen indicated at this time. Referred for blood work to monitor inflammatory markers and general health.

## 2022-08-28 NOTE — Assessment & Plan Note (Signed)
Plan to investigate the cause of patient's fatigue through comprehensive lab workup and referral to sleep studies to rule out conditions such as sleep apnea.  Differential diagnosis includes sleep apnea, anemia, thyroid dysfunction, and exacerbation of autoimmune conditions.

## 2022-08-28 NOTE — Assessment & Plan Note (Signed)
Plan to trial fluticasone (FLONASE), azelastine (ASTELIN), and montelukast (SINGULAIR) to manage allergy symptoms. Advised patient to consider seeing an allergist for further management, given extensive allergy history.

## 2022-08-28 NOTE — Assessment & Plan Note (Signed)
Given family history of prostate cancer, patient wishes to monitor PSA levels despite the controversy surrounding the efficacy of PSA screenings. Added PSA to lab work for monitoring purposes.

## 2022-08-30 ENCOUNTER — Encounter: Payer: Self-pay | Admitting: Family Medicine

## 2022-08-30 LAB — TESTOSTERONE,FREE AND TOTAL
Testosterone, Free: 7.8 pg/mL (ref 6.8–21.5)
Testosterone: 512 ng/dL (ref 264–916)

## 2022-08-31 ENCOUNTER — Ambulatory Visit: Payer: BC Managed Care – PPO | Admitting: Family Medicine

## 2022-09-22 NOTE — Progress Notes (Signed)
Darrell Jordan [address] [place] Phone: [phone] Subjective:   [name], am serving as a scribe for Dr. Hulan Saas.  I'm seeing this patient by the request  of:  Bonnita Hollow, MD  CC: Back and neck pain follow-up  [id number]  Darrell Jordan is a 47 y.o. male coming in with complaint of back and neck pain. OMT on 08/18/2022. Patient states here for manipulation. Shoulder is feeling better. Wants to talk to about life things.  Feels like he is just more fatigued than usual.  Patient states that continues to have some discomfort.  Was doing some different medications for seasonal allergies and was feeling better with that but then unfortunately started having cramping in his legs.          Reviewed prior external information including notes and imaging from previsou exam, outside providers and external EMR if available.   As well as notes that were available from care everywhere and other healthcare systems.  Past medical history, social, surgical and family history all reviewed in electronic medical record.  No pertanent information unless stated regarding to the chief complaint.   Past Medical History:  Diagnosis Date   Allergy 1980   Depression 1990   Mild intermittent asthma, uncomplicated [id number]   Rheumatoid arthritis ([facility])     Allergies  Allergen Reactions   Corn-Containing Products    Egg-Derived Products    Gabapentin    Peanut-Containing Drug Products    Soy Allergy    Sulfasalazine    Wheat      Review of Systems:  No headache, visual changes, nausea, vomiting, diarrhea, constipation, dizziness, abdominal pain, skin rash, fevers, chills, night sweats, weight loss, swollen lymph nodes, body aches, joint swelling, chest pain, shortness of breath, mood changes. POSITIVE muscle aches  Objective  Blood pressure 120/72, pulse 72, height 5\' 11"  (1.803 m), weight 201 lb (91.2 kg), SpO2 98 %.    General: No apparent distress alert and oriented x3 mood and affect normal, dressed appropriately.  HEENT: Pupils equal, extraocular movements intact  Respiratory: Patient's speak in full sentences and does not appear short of breath  Cardiovascular: No lower extremity edema, non tender, no erythema  Neck exam does have some loss of lordosis.  No cervical lymphadenopathy noted.  Patient's low back has tightness noted as well.  Seems to be more in the thoracolumbar juncture.  Seems to have some in the right sacroiliac joint that is different than patient usually. Left shoulder exam still has some mild positive impingement and O'Brien's test noted.  Rotator cuff strength though intact.  Does have limited sidebending of the neck to the left.  Osteopathic findings  C2 flexed rotated and side bent right C5 flexed rotated and side bent left T3 extended rotated and side bent right inhaled rib T8 extended rotated and side bent left L2 flexed rotated and side bent right Sacrum right on right    Assessment and Plan:  Pain in right hip Mild increase in more of what seems to be more of the right sacroiliac joint.  Discussed posture and ergonomics, home exercises, icing regimen.  With some of the worsening symptoms will get patient in with formal physical therapy for this as well as the shoulder.  Follow-up again in 6 to 8 weeks  Left shoulder pain Some improvement but still not at baseline.  Will have patient start with formal physical therapy.  Referral placed    Nonallopathic problems  Decision today to treat with OMT was based on Physical Exam  After verbal consent patient was treated with HVLA, ME, FPR techniques in cervical, rib, thoracic, lumbar, and sacral  areas  Patient tolerated the procedure well with improvement in symptoms  Patient given exercises, stretches and lifestyle modifications  See medications in patient instructions if given  Patient will follow up in 4-8 weeks      The above documentation has been reviewed and is accurate and complete Lyndal Pulley, DO         Note: This dictation was prepared with Dragon dictation along with smaller phrase technology. Any transcriptional errors that result from this process are unintentional.

## 2022-09-29 ENCOUNTER — Ambulatory Visit: Payer: BC Managed Care – PPO | Admitting: Family Medicine

## 2022-09-29 ENCOUNTER — Encounter: Payer: Self-pay | Admitting: Family Medicine

## 2022-09-29 VITALS — BP 120/72 | HR 72 | Ht 71.0 in | Wt 201.0 lb

## 2022-09-29 DIAGNOSIS — M9908 Segmental and somatic dysfunction of rib cage: Secondary | ICD-10-CM | POA: Diagnosis not present

## 2022-09-29 DIAGNOSIS — M9901 Segmental and somatic dysfunction of cervical region: Secondary | ICD-10-CM

## 2022-09-29 DIAGNOSIS — M9903 Segmental and somatic dysfunction of lumbar region: Secondary | ICD-10-CM | POA: Diagnosis not present

## 2022-09-29 DIAGNOSIS — M25512 Pain in left shoulder: Secondary | ICD-10-CM

## 2022-09-29 DIAGNOSIS — M25551 Pain in right hip: Secondary | ICD-10-CM

## 2022-09-29 DIAGNOSIS — M9902 Segmental and somatic dysfunction of thoracic region: Secondary | ICD-10-CM

## 2022-09-29 DIAGNOSIS — M9904 Segmental and somatic dysfunction of sacral region: Secondary | ICD-10-CM | POA: Diagnosis not present

## 2022-09-29 NOTE — Assessment & Plan Note (Signed)
Some improvement but still not at baseline.  Will have patient start with formal physical therapy.  Referral placed

## 2022-09-29 NOTE — Assessment & Plan Note (Addendum)
Mild increase in more of what seems to be more of the right sacroiliac joint.  Discussed posture and ergonomics, home exercises, icing regimen.  With some of the worsening symptoms will get patient in with formal physical therapy for this as well as the shoulder.  Follow-up again in 6 to 8 weeks

## 2022-09-29 NOTE — Patient Instructions (Addendum)
Good to see you! PT referral [facility] See you again in 2 months

## 2022-10-06 DIAGNOSIS — M25519 Pain in unspecified shoulder: Secondary | ICD-10-CM | POA: Diagnosis not present

## 2022-10-13 DIAGNOSIS — M25519 Pain in unspecified shoulder: Secondary | ICD-10-CM | POA: Diagnosis not present

## 2022-10-20 ENCOUNTER — Other Ambulatory Visit: Payer: Self-pay | Admitting: Family Medicine

## 2022-10-20 ENCOUNTER — Encounter: Payer: Self-pay | Admitting: Family Medicine

## 2022-10-20 DIAGNOSIS — E559 Vitamin D deficiency, unspecified: Secondary | ICD-10-CM

## 2022-10-20 DIAGNOSIS — M25519 Pain in unspecified shoulder: Secondary | ICD-10-CM | POA: Diagnosis not present

## 2022-10-20 MED ORDER — PREDNISONE 50 MG PO TABS: 50.0000 mg | ORAL_TABLET | Freq: Every day | ORAL | 0 refills | Status: DC

## 2022-10-21 NOTE — Telephone Encounter (Signed)
Left patient a detailed voice message advising patient  to return call to office to schedule lab visit to recheck his vitamin d levels. Then once we get those results we can provide recommendations regarding refill.

## 2022-10-25 ENCOUNTER — Other Ambulatory Visit: Payer: BC Managed Care – PPO

## 2022-10-25 DIAGNOSIS — E559 Vitamin D deficiency, unspecified: Secondary | ICD-10-CM | POA: Diagnosis not present

## 2022-10-25 NOTE — Progress Notes (Signed)
1 gold tube. Dm/cma

## 2022-10-28 LAB — VITAMIN D 1,25 DIHYDROXY
Vitamin D 1, 25 (OH)2 Total: 46 pg/mL (ref 18–72)
Vitamin D2 1, 25 (OH)2: 12 pg/mL
Vitamin D3 1, 25 (OH)2: 34 pg/mL

## 2022-10-31 NOTE — Progress Notes (Unsigned)
[name] [address] [place] [address] Phone: [phone] Subjective:    I'm seeing this patient by the request  of:  Garnette Gunner, MD  CC: Back and neck pain follow-up  [id number]  Darrell Jordan is a 47 y.o. male coming in with complaint of back and neck pain. OMT on 09/29/2022. Patient states doing well. No new concerns.  Has been doing some activities.  Did have the aggravation and has been on prednisone, is titrating down at the moment.  Medications patient has been prescribed: Prednisone  Taking:         Reviewed prior external information including notes and imaging from previsou exam, outside providers and external EMR if available.   As well as notes that were available from care everywhere and other healthcare systems.  Past medical history, social, surgical and family history all reviewed in electronic medical record.  No pertanent information unless stated regarding to the chief complaint.   Past Medical History:  Diagnosis Date   Allergy 1980   Depression 1990   Mild intermittent asthma, uncomplicated 11/28/2020   Rheumatoid arthritis (HCC)     Allergies  Allergen Reactions   Corn-Containing Products    Egg-Derived Products    Gabapentin    Peanut-Containing Drug Products    Soy Allergy    Sulfasalazine    Wheat      Review of Systems:  No headache, visual changes, nausea, vomiting, diarrhea, constipation, dizziness, abdominal pain, skin rash, fevers, chills, night sweats, weight loss, swollen lymph nodes, body aches, joint swelling, chest pain, shortness of breath, mood changes. POSITIVE muscle aches  Objective  Blood pressure 118/70, pulse 74, height 5\' 11"  (1.803 m), weight 200 lb (90.7 kg), SpO2 99 %.   General: No apparent distress alert and oriented x3 mood and affect normal, dressed appropriately.  HEENT: Pupils equal, extraocular movements intact  Respiratory: Patient's speak in full sentences  and does not appear short of breath  Cardiovascular: No lower extremity edema, non tender, no erythema  Neck exam does have some mild loss of lordosis noted. Back exam does have some tightness noted over the right sacroiliac joint.  Osteopathic findings  C2 flexed rotated and side bent right C4 flexed rotated and side bent left C7 flexed rotated and side bent left T3 extended rotated and side bent right inhaled rib T9 extended rotated and side bent left L2 flexed rotated and side bent right Sacrum right on right       Assessment and Plan:  Seronegative rheumatoid arthritis (HCC) Will continue to monitor, does respond well to osteopathic manipulation.  Discussed which activities to do and which ones to avoid.  Increase activity slowly over the course of next several weeks.  We did give patient prednisone to help with slowly decrease and titrate off of the prednisone.  Discussed icing regimen and home exercises.  Follow-up again in 6 to 8 weeks otherwise.    Nonallopathic problems  Decision today to treat with OMT was based on Physical Exam  After verbal consent patient was treated with HVLA, ME, FPR techniques in cervical, rib, thoracic, lumbar, and sacral  areas  Patient tolerated the procedure well with improvement in symptoms  Patient given exercises, stretches and lifestyle modifications  See medications in patient instructions if given  Patient will follow up in 4-8 weeks     The above documentation has been reviewed and is accurate and complete Judi Saa, DO  Note: This dictation was prepared with Dragon dictation along with smaller phrase technology. Any transcriptional errors that result from this process are unintentional.

## 2022-11-01 ENCOUNTER — Encounter: Payer: Self-pay | Admitting: Family Medicine

## 2022-11-01 ENCOUNTER — Ambulatory Visit: Payer: BC Managed Care – PPO | Admitting: Family Medicine

## 2022-11-01 VITALS — BP 118/70 | HR 74 | Ht 71.0 in | Wt 200.0 lb

## 2022-11-01 DIAGNOSIS — M9908 Segmental and somatic dysfunction of rib cage: Secondary | ICD-10-CM | POA: Diagnosis not present

## 2022-11-01 DIAGNOSIS — M9904 Segmental and somatic dysfunction of sacral region: Secondary | ICD-10-CM

## 2022-11-01 DIAGNOSIS — M9903 Segmental and somatic dysfunction of lumbar region: Secondary | ICD-10-CM | POA: Diagnosis not present

## 2022-11-01 DIAGNOSIS — M9902 Segmental and somatic dysfunction of thoracic region: Secondary | ICD-10-CM

## 2022-11-01 DIAGNOSIS — M0600 Rheumatoid arthritis without rheumatoid factor, unspecified site: Secondary | ICD-10-CM | POA: Diagnosis not present

## 2022-11-01 DIAGNOSIS — M9901 Segmental and somatic dysfunction of cervical region: Secondary | ICD-10-CM | POA: Diagnosis not present

## 2022-11-01 MED ORDER — PREDNISONE 20 MG PO TABS: 20.0000 mg | ORAL_TABLET | Freq: Two times a day (BID) | ORAL | 0 refills | Status: DC

## 2022-11-01 NOTE — Assessment & Plan Note (Addendum)
Will continue to monitor, does respond well to osteopathic manipulation.  Discussed which activities to do and which ones to avoid.  Increase activity slowly over the course of next several weeks.  We did give patient prednisone to help with slowly decrease and titrate off of the prednisone.  Discussed icing regimen and home exercises.  Follow-up again in 6 to 8 weeks otherwise.

## 2022-11-01 NOTE — Patient Instructions (Addendum)
Prednisone 20mg  BID for 10 days Keep being active that's your rehab See you again in 8 weeks

## 2022-11-06 DIAGNOSIS — M25519 Pain in unspecified shoulder: Secondary | ICD-10-CM | POA: Diagnosis not present

## 2022-11-15 ENCOUNTER — Ambulatory Visit: Payer: BC Managed Care – PPO | Admitting: Family Medicine

## 2022-12-12 ENCOUNTER — Other Ambulatory Visit: Payer: Self-pay | Admitting: Family Medicine

## 2023-01-10 ENCOUNTER — Other Ambulatory Visit: Payer: Self-pay | Admitting: Family Medicine

## 2023-03-11 ENCOUNTER — Other Ambulatory Visit: Payer: Self-pay | Admitting: Family Medicine

## 2023-03-11 DIAGNOSIS — J302 Other seasonal allergic rhinitis: Secondary | ICD-10-CM

## 2023-06-13 ENCOUNTER — Ambulatory Visit
Admission: RE | Admit: 2023-06-13 | Discharge: 2023-06-13 | Disposition: A | Discharge status: Home / Self Care | Payer: BC Managed Care – PPO | Source: Ambulatory Visit | Attending: Internal Medicine | Admitting: Internal Medicine

## 2023-06-13 ENCOUNTER — Ambulatory Visit (INDEPENDENT_AMBULATORY_CARE_PROVIDER_SITE_OTHER): Payer: BC Managed Care – PPO

## 2023-06-13 VITALS — BP 112/74 | HR 68 | Temp 99.3°F | Resp 20

## 2023-06-13 DIAGNOSIS — J208 Acute bronchitis due to other specified organisms: Secondary | ICD-10-CM | POA: Diagnosis not present

## 2023-06-13 DIAGNOSIS — J4530 Mild persistent asthma, uncomplicated: Secondary | ICD-10-CM

## 2023-06-13 DIAGNOSIS — R059 Cough, unspecified: Secondary | ICD-10-CM | POA: Diagnosis not present

## 2023-06-13 MED ORDER — ALBUTEROL SULFATE HFA 108 (90 BASE) MCG/ACT IN AERS: 1.0000 | INHALATION_SPRAY | Freq: Four times a day (QID) | RESPIRATORY_TRACT | 0 refills | Status: AC | PRN

## 2023-06-13 MED ORDER — PROMETHAZINE-DM 6.25-15 MG/5ML PO SYRP: 5.0000 mL | ORAL_SOLUTION | Freq: Three times a day (TID) | ORAL | 0 refills | Status: DC | PRN

## 2023-06-13 MED ORDER — PREDNISONE 50 MG PO TABS: 50.0000 mg | ORAL_TABLET | Freq: Every day | ORAL | 0 refills | Status: DC

## 2023-06-13 NOTE — Discharge Instructions (Addendum)
Please start prednisone for suspected viral bronchitis. Pending x-ray results will finalize diagnoses and treatment plan. For now, start an oral prednisone course. Keep using your albuterol inhaler. Use cough syrup as needed.

## 2023-06-13 NOTE — ED Provider Notes (Signed)
[facility]  Note:  This document was prepared using Conservation officer, historic buildings and may include unintentional dictation errors.  MRN: [id number] DOB: 1976-06-26  Subjective:   Darrell Jordan is a 47 y.o. male presenting for 4 day history of acute onset persistent coughing with associated chest pain, shortness of breath, wheezing, chest tightness.  Has a history of asthma.  Would like a refill of his inhaler.  Reports subjective fever here in the clinic.  No current facility-administered medications for this encounter.  Current Outpatient Medications:    albuterol (VENTOLIN HFA) 108 (90 Base) MCG/ACT inhaler, Inhale 2 puffs into the lungs every 6 (six) hours as needed., Disp: 18 g, Rfl: 2   azelastine (ASTELIN) 0.1 % nasal spray, Place 2 sprays into both nostrils 2 (two) times daily., Disp: 30 mL, Rfl: 12   B Complex Vitamins (B COMPLEX PO), Take by mouth daily., Disp: , Rfl:    Cetirizine HCl (ZYRTEC ALLERGY PO), Take by mouth., Disp: , Rfl:    Cholecalciferol (VITAMIN D3 PO), Take by mouth daily., Disp: , Rfl:    EPINEPHrine 0.3 mg/0.3 mL IJ SOAJ injection, Inject 0.3 mg into the muscle as needed for anaphylaxis., Disp: 2 each, Rfl: 1   fluticasone (FLONASE) 50 MCG/ACT nasal spray, Place 2 sprays into both nostrils daily., Disp: 16 g, Rfl: 6   montelukast (SINGULAIR) 10 MG tablet, Take 1 tablet (10 mg total) by mouth at bedtime., Disp: 30 tablet, Rfl: 3   predniSONE (DELTASONE) 20 MG tablet, Take 1 tablet (20 mg total) by mouth 2 (two) times daily., Disp: 20 tablet, Rfl: 0   predniSONE (DELTASONE) 50 MG tablet, Take 1 tablet (50 mg total) by mouth daily., Disp: 5 tablet, Rfl: 0   QUERCETIN PO, Take by mouth daily., Disp: , Rfl:    tiZANidine (ZANAFLEX) 2 MG tablet, TAKE 1 TABLET BY MOUTH EVERYDAY AT BEDTIME, Disp: 30 tablet, Rfl: 0   traZODone (DESYREL) 50 MG tablet, TAKE 1 TABLET BY MOUTH EVERYDAY AT BEDTIME, Disp: 90 tablet, Rfl: 1   Vitamin D,  Ergocalciferol, (DRISDOL) 1.25 MG (50000 UNIT) CAPS capsule, Take 1 capsule (50,000 Units total) by mouth every 7 (seven) days., Disp: 5 capsule, Rfl: 0   Allergies  Allergen Reactions   Corn-Containing Products    Egg-Derived Products    Gabapentin    Peanut-Containing Drug Products    Soy Allergy (Do Not Select)    Sulfasalazine    Wheat     Past Medical History:  Diagnosis Date   Allergy 1980   Depression 1990   Mild intermittent asthma, uncomplicated 11/28/2020   Rheumatoid arthritis (HCC)      Past Surgical History:  Procedure Laterality Date   VASECTOMY  2010    Family History  Problem Relation Age of Onset   Hypotension Mother    Atrial fibrillation Mother    Other Father        joint pain, undiagnosed   Prostate cancer Father 29   Bone cancer Father        mets from prostate cancer   Arthritis Father    Cancer Father    Rheum arthritis Brother 30   Lupus Paternal Aunt    Heart disease Maternal Grandmother        valve replacement   Heart attack Maternal Grandfather 40   Esophageal cancer Paternal Grandmother    Heart disease Paternal Grandmother    Alcohol abuse Paternal Grandfather    Emphysema Paternal Grandfather  Colon polyps Neg Hx    Colon cancer Neg Hx    Stomach cancer Neg Hx    Rectal cancer Neg Hx     Social History   Tobacco Use   Smoking status: Former    Current packs/day: 0.00    Average packs/day: 1 pack/day for 21.0 years (21.0 ttl pk-yrs)    Types: Cigarettes    Start date: 42    Quit date: 06/27/2017    Years since quitting: 5.9    Passive exposure: Never   Smokeless tobacco: Never  Vaping Use   Vaping status: Never Used  Substance Use Topics   Alcohol use: Not Currently   Drug use: Not Currently    ROS   Objective:   Vitals: BP 112/74 (BP Location: Right Arm)   Pulse 68   Temp 99.3 F (37.4 C) (Oral)   Resp 20   SpO2 95%   Physical Exam Constitutional:      General: He is not in acute distress.     Appearance: Normal appearance. He is well-developed. He is not ill-appearing, toxic-appearing or diaphoretic.  HENT:     Head: Normocephalic and atraumatic.     Right Ear: External ear normal.     Left Ear: External ear normal.     Nose: Nose normal.     Mouth/Throat:     Mouth: Mucous membranes are moist.  Eyes:     General: No scleral icterus.       Right eye: No discharge.        Left eye: No discharge.     Extraocular Movements: Extraocular movements intact.  Cardiovascular:     Rate and Rhythm: Normal rate and regular rhythm.     Heart sounds: Normal heart sounds. No murmur heard.    No friction rub. No gallop.  Pulmonary:     Comments: Patient had significant difficulty with pulmonary exam.  Unable to auscultate lung sounds. Neurological:     Mental Status: He is alert and oriented to person, place, and time.  Psychiatric:        Mood and Affect: Mood normal.        Behavior: Behavior normal.        Thought Content: Thought content normal.     Assessment and Plan :   PDMP not reviewed this encounter.  1. Acute viral bronchitis   2. Mild persistent asthma, uncomplicated    Recommended management for an acute viral bronchitis with prednisone, supportive care.  Refilled his albuterol inhaler.  X-ray over-read was pending at time of discharge, recommended follow up with only abnormal results. Otherwise will not call for negative over-read. Patient was in agreement.  Discussed antibiotic stewardship and will use antibiotics only if x-ray demonstrates pneumonia.  Counseled patient on potential for adverse effects with medications prescribed/recommended today, ER and return-to-clinic precautions discussed, patient verbalized understanding.    Wallis Bamberg, [place] 06/13/23 [phone]

## 2023-06-13 NOTE — ED Triage Notes (Signed)
Pt c/o cough started 12/14-denies known fever-NAD-steady gait

## 2023-09-24 ENCOUNTER — Other Ambulatory Visit: Payer: Self-pay | Admitting: Family Medicine

## 2023-09-24 DIAGNOSIS — J302 Other seasonal allergic rhinitis: Secondary | ICD-10-CM

## 2023-10-27 NOTE — Progress Notes (Unsigned)
 [facility] [address] [place] [address] Phone: [phone] Subjective:   Darrell Jordan, am serving as a scribe for Dr. Ronnell Coins.  I'm seeing this patient by the request  of:  Catheryn Cluck, MD  CC: back and neck pain follow up   [id number]  [name] is a 48 y.o. male coming in with complaint of back and neck pain. OMT May 2024. Patient states that his upper back and neck have been more painful. Wakes up with hands a bit stiffer and her wonders if this is coming from his neck.   Medications patient has been prescribed: None  Taking:         Reviewed prior external information including notes and imaging from previsou exam, outside providers and external EMR if available.   As well as notes that were available from care everywhere and other healthcare systems.  Past medical history, social, surgical and family history all reviewed in electronic medical record.  No pertanent information unless stated regarding to the chief complaint.   Past Medical History:  Diagnosis Date   Allergy 1980   Depression 1990   Mild intermittent asthma, uncomplicated 11/28/2020   Rheumatoid arthritis (HCC)     Allergies  Allergen Reactions   Corn-Containing Products    Egg-Derived Products    Gabapentin    Peanut-Containing Drug Products    Soy Allergy (Obsolete)    Sulfasalazine    Wheat      Review of Systems:  No headache, visual changes, nausea, vomiting, diarrhea, constipation, dizziness, abdominal pain, skin rash, fevers, chills, night sweats, weight loss, swollen lymph nodes, body aches, joint swelling, chest pain, shortness of breath, mood changes. POSITIVE muscle aches  Objective  Blood pressure 132/82, pulse 77, height 5\' 11"  (1.803 m), weight 206 lb (93.4 kg), SpO2 98%.   General: No apparent distress alert and oriented x3 mood and affect normal, dressed appropriately.  HEENT: Pupils equal, extraocular movements  intact  Respiratory: Patient's speak in full sentences and does not appear short of breath  Cardiovascular: No lower extremity edema, non tender, no erythema  Gait MSK:  Back does have some loss lordosis.  Neck exam does have significant tightness with sidebending bilaterally.  Lacks last 10 degrees of extension at the end.  Osteopathic findings  C2 flexed rotated and side bent right C3 flexed rotated and side bent left T3 extended rotated and side bent right inhaled rib T4 extended rotated and side bent left L1 flexed rotated and side bent right L3 flexed rotated and side Sacrum right on right       Assessment and Plan:  Degenerative cervical disc Known degenerative disc of the neck noted.  Refill on the Zanaflex ) milligrams to take at night as needed.  Increase activity slowly.  Follow-up again in 6 to 8 weeks    Nonallopathic problems  Decision today to treat with OMT was based on Physical Exam  After verbal consent patient was treated with HVLA, ME, FPR techniques in cervical, rib, thoracic, lumbar, and sacral  areas  Patient tolerated the procedure well with improvement in symptoms  Patient given exercises, stretches and lifestyle modifications  See medications in patient instructions if given  Patient will follow up in 4-8 weeks    The above documentation has been reviewed and is accurate and complete Isidro Margo, DO          Note: This dictation was prepared with Dragon dictation along with smaller phrase  technology. Any transcriptional errors that result from this process are unintentional.

## 2023-10-30 ENCOUNTER — Ambulatory Visit: Admitting: Family Medicine

## 2023-10-30 ENCOUNTER — Encounter: Payer: Self-pay | Admitting: Family Medicine

## 2023-10-30 ENCOUNTER — Ambulatory Visit (INDEPENDENT_AMBULATORY_CARE_PROVIDER_SITE_OTHER)

## 2023-10-30 VITALS — BP 132/82 | HR 77 | Ht 71.0 in | Wt 206.0 lb

## 2023-10-30 DIAGNOSIS — M9903 Segmental and somatic dysfunction of lumbar region: Secondary | ICD-10-CM

## 2023-10-30 DIAGNOSIS — M9904 Segmental and somatic dysfunction of sacral region: Secondary | ICD-10-CM

## 2023-10-30 DIAGNOSIS — M9908 Segmental and somatic dysfunction of rib cage: Secondary | ICD-10-CM

## 2023-10-30 DIAGNOSIS — M9901 Segmental and somatic dysfunction of cervical region: Secondary | ICD-10-CM

## 2023-10-30 DIAGNOSIS — M542 Cervicalgia: Secondary | ICD-10-CM | POA: Diagnosis not present

## 2023-10-30 DIAGNOSIS — M5030 Other cervical disc degeneration, unspecified cervical region: Secondary | ICD-10-CM

## 2023-10-30 DIAGNOSIS — M9902 Segmental and somatic dysfunction of thoracic region: Secondary | ICD-10-CM

## 2023-10-30 MED ORDER — TIZANIDINE HCL 2 MG PO TABS: 2.0000 mg | ORAL_TABLET | Freq: Every day | ORAL | 0 refills | Status: AC

## 2023-10-30 NOTE — Assessment & Plan Note (Signed)
 Known degenerative disc of the neck noted.  Refill on the Zanaflex ) milligrams to take at night as needed.  Increase activity slowly.  Follow-up again in 6 to 8 weeks

## 2023-10-30 NOTE — Patient Instructions (Signed)
 Doing well overall Zanaflex  refill See me in 6-12 weeks

## 2023-11-02 ENCOUNTER — Ambulatory Visit: Admitting: Family Medicine

## 2023-11-02 ENCOUNTER — Encounter (HOSPITAL_COMMUNITY): Payer: Self-pay

## 2023-11-09 ENCOUNTER — Encounter: Payer: Self-pay | Admitting: Family Medicine

## 2023-11-09 ENCOUNTER — Ambulatory Visit: Admitting: Family Medicine

## 2023-11-09 VITALS — BP 105/74 | HR 64 | Temp 97.8°F | Resp 18 | Ht 71.0 in | Wt 199.8 lb

## 2023-11-09 DIAGNOSIS — Z1159 Encounter for screening for other viral diseases: Secondary | ICD-10-CM | POA: Diagnosis not present

## 2023-11-09 DIAGNOSIS — Z125 Encounter for screening for malignant neoplasm of prostate: Secondary | ICD-10-CM

## 2023-11-09 DIAGNOSIS — E782 Mixed hyperlipidemia: Secondary | ICD-10-CM

## 2023-11-09 DIAGNOSIS — J4520 Mild intermittent asthma, uncomplicated: Secondary | ICD-10-CM

## 2023-11-09 DIAGNOSIS — R002 Palpitations: Secondary | ICD-10-CM | POA: Diagnosis not present

## 2023-11-09 DIAGNOSIS — R072 Precordial pain: Secondary | ICD-10-CM | POA: Diagnosis not present

## 2023-11-09 DIAGNOSIS — Z0000 Encounter for general adult medical examination without abnormal findings: Secondary | ICD-10-CM

## 2023-11-09 DIAGNOSIS — M0600 Rheumatoid arthritis without rheumatoid factor, unspecified site: Secondary | ICD-10-CM | POA: Diagnosis not present

## 2023-11-09 DIAGNOSIS — F17298 Nicotine dependence, other tobacco product, with other nicotine-induced disorders: Secondary | ICD-10-CM | POA: Diagnosis not present

## 2023-11-09 NOTE — Patient Instructions (Signed)
  VISIT SUMMARY: Today, you were seen for chest pressure and palpitations that you have been experiencing over the last month. We discussed your symptoms, medical history, and family history of heart disease. Your blood pressure was normal today, and we reviewed your current medications and lifestyle habits.  YOUR PLAN: -CHEST PAIN WITH PALPITATIONS: Chest pain and palpitations can be caused by various factors, including heart issues, inflammation of the chest wall, or anxiety. We will conduct several tests to assess your heart health, including an EKG, metabolic panel, thyroid  function tests, and possibly an echocardiogram and coronary artery calcium score CT scan. We may also use a Zio patch for long-term heart monitoring if needed. Additionally, a chest x-ray will help rule out lung-related causes, and we will check for inflammation related to your rheumatoid arthritis. Routine cholesterol and diabetes screening will also be done.  -MILD ASTHMA: Asthma is a condition where your airways can become inflamed and narrow, making it hard to breathe. Your asthma is currently well-controlled with Zyrtec, and you have no symptoms at this time.  -RHEUMATOID ARTHRITIS: Rheumatoid arthritis is an autoimmune condition that causes joint inflammation and pain. Your symptoms have significantly improved over the past year due to increased physical activity and reduced stress.  -NICOTINE DEPENDENCE, IN REMISSION: Nicotine dependence means having an addiction to nicotine, which you have successfully managed by quitting smoking in 2019 and using nicotine lozenges. We discussed the challenges of quitting nicotine and the potential effects of withdrawal.  INSTRUCTIONS: Please complete the following tests: EKG, metabolic panel, thyroid  function tests, chest x-ray, ESR, CRP, cholesterol recheck, and diabetes screening. If the EKG shows any abnormalities, we will proceed with an echocardiogram and possibly a coronary artery  calcium score CT scan. Consider using a Zio patch for long-term heart monitoring if needed. Follow up with us  after completing these tests to discuss the results and next steps.  For chest xray, go to:   It is a walk in.  [facility] [address] [place], [place], [place] [address] Phone: [phone]

## 2023-11-09 NOTE — Progress Notes (Signed)
 Assessment  Assessment/Plan:    Assessment and Plan Assessment & Plan Chest pain with palpitations Intermittent chest pain and palpitations over the last month, localized to the chest center, described as pressure with occasional fluttering sensation radiating to the neck. No associated dyspnea or exertional trigger. Family history of early myocardial infarction. Differential includes cardiac causes, costochondritis, and anxiety. Normal blood pressure today. No current anxiety symptoms. Tenderness in the chest suggests possible costochondritis. No evidence of autoimmune flare. Discussed potential need for further cardiac evaluation, including EKG, echocardiogram, and coronary artery calcium score CT scan, to assess cardiac risk and provide tools for cardiology consultation. Considered Zio patch for long-term cardiac monitoring if EKG is inconclusive. - Order EKG to assess cardiac function.  Mild baseline abnormalities - Order metabolic panel to check electrolytes, including potassium, sodium, and magnesium. - Order thyroid  function tests. - Order echocardiogram - Consider coronary artery calcium score CT scan for further cardiac risk assessment. - Consider Zio patch for long-term cardiac monitoring if EKG is inconclusive. - Order chest x-ray to rule out pulmonary causes. - Order ESR and CRP to assess for inflammation related to rheumatoid arthritis. - Recheck cholesterol and screen for diabetes as part of routine physical.  Mild asthma Asthma with no current symptoms. No recent use of albuterol  inhaler. Allergies well-controlled with Zyrtec. No wheezing or respiratory distress reported.  Rheumatoid arthritis Rheumatoid arthritis with significant improvement in symptoms over the past year. No current autoimmune symptoms. Increased physical activity and reduced stress have contributed to symptom improvement.  Nicotine dependence, in remission Nicotine dependence, currently using nicotine  lozenges. No smoking since 2019. Discussed challenges of nicotine cessation and potential cognitive effects of withdrawal. No current use of tobacco products.      Medications Discontinued During This Encounter  Medication Reason   montelukast  (SINGULAIR ) 10 MG tablet    predniSONE  (DELTASONE ) 50 MG tablet    promethazine -dextromethorphan (PROMETHAZINE -DM) 6.25-15 MG/5ML syrup    B Complex Vitamins (B COMPLEX PO)     Patient Counseling(The following topics were reviewed and/or handout was given):  -Nutrition: Stressed importance of moderation in sodium/caffeine intake, saturated fat and cholesterol, caloric balance, sufficient intake of fresh fruits, vegetables, and fiber.  -Stressed the importance of regular exercise.   -Substance Abuse: Discussed cessation/primary prevention of tobacco, alcohol, or other drug use; driving or other dangerous activities under the influence; availability of treatment for abuse.   -Injury prevention: Discussed safety belts, safety helmets, smoke detector, smoking near bedding or upholstery.   -Sexuality: Discussed sexually transmitted diseases, partner selection, use of condoms, avoidance of unintended pregnancy and contraceptive alternatives.   -Dental health: Discussed importance of regular tooth brushing, flossing, and dental visits.  -Health maintenance and immunizations reviewed. Please refer to Health maintenance section.  Return if symptoms worsen or fail to improve.        Subjective:   Encounter date: 11/09/2023  Chief Complaint  Patient presents with   Annual Exam    Pt is fasting today   HM due- HIV and Hep C screening     Discussed the use of AI scribe software for clinical note transcription with the patient, who gave verbal consent to proceed.  History of Present Illness Darrell Jordan is a 48 year old male who presents with chest pressure and palpitations.  He has experienced chest pressure and palpitations over the last month.  The chest pressure is located in the middle of his chest and occasionally radiates to his neck. These sensations are not associated  with anxiety, exertion, or shortness of breath. His blood pressure was higher than normal two weeks ago but has since returned to normal levels. He has not tried any specific treatments for these symptoms other than practicing better breathing techniques.  He has a history of mild asthma, previously triggered by allergies, but does not report current symptoms. He uses Zyrtec daily for allergies and finds it effective, but has not been using nasal sprays or Singulair  recently. No wheezing or shortness of breath. He has a history of using an albuterol  inhaler for respiratory infections.  He has a history of autoimmune activity, specifically rheumatoid arthritis, but has been symptom-free for the past twelve months. He attributes his improvement to increased physical activity, reduced stress, and a plant-based diet. He has experienced a significant reduction in pain levels over the last year.  He has a history of nicotine addiction and currently uses nicotine lozenges, having quit smoking in 2019. He does not smoke or vape.  He follows a plant-based diet since November and reports feeling physically better than he has in ten years. He engages in regular physical activity, including yoga and gardening, and has a low-stress job which he believes contributes to his overall well-being.  He does not experience heartburn except when consuming coconut, which causes significant discomfort and acid reflux. He avoids coconut due to these symptoms.  He uses trazodone  for sleep and reports it is effective. No daytime fatigue or symptoms of sleep apnea.  He has a family history of heart disease, with his grandfather having died of a massive heart attack at the age of 80.       11/09/2023    2:40 PM 08/26/2022    9:04 AM 10/18/2021    3:20 PM 12/02/2020   11:43 AM 12/02/2020   11:19  AM  Depression screen PHQ 2/9  Decreased Interest 0 2 0 3 3  Down, Depressed, Hopeless 0 0 1 0 0  PHQ - 2 Score 0 2 1 3 3   Altered sleeping 0 2 0 1 1  Tired, decreased energy 0 1 0 1 1  Change in appetite 1 0 0 0 0  Feeling bad or failure about yourself  0 0 0 0 0  Trouble concentrating 0 2 1 2 2   Moving slowly or fidgety/restless 0 0 0 0 0  Suicidal thoughts 0 0 0 0 0  PHQ-9 Score 1 7 2 7 7   Difficult doing work/chores Not difficult at all Somewhat difficult Not difficult at all         11/09/2023    2:40 PM 08/26/2022    9:04 AM  GAD 7 : Generalized Anxiety Score  Nervous, Anxious, on Edge 0 1  Control/stop worrying 0 0  Worry too much - different things 0 1  Trouble relaxing 1 2  Restless 0 2  Easily annoyed or irritable 1 2  Afraid - awful might happen 0 2  Total GAD 7 Score 2 10  Anxiety Difficulty Not difficult at all Somewhat difficult    Health Maintenance Due  Topic Date Due   HIV Screening  Never done   Hepatitis C Screening  Never done      PMH:  The following were reviewed and entered/updated in epic: Past Medical History:  Diagnosis Date   Allergy 1980   Depression 1990   Mild intermittent asthma, uncomplicated 11/28/2020   Rheumatoid arthritis [facility])     Patient Active Problem List   Diagnosis Date Noted   Palpitations  11/09/2023   Nicotine dependence 08/28/2022   Chronic fatigue 08/28/2022   Family history of prostate cancer 08/28/2022   Hyperlipidemia 11/17/2021   Encounter for medication monitoring 11/17/2021   Screening PSA (prostate specific antigen) 11/17/2021   Seasonal allergies 11/17/2021   Pain in left acromioclavicular joint 09/14/2021   Pain in right hip 03/12/2021   Piriformis syndrome of both sides 02/10/2021   Left shoulder pain 12/24/2020   Dyshidrotic eczema 11/28/2020   Chronic rhinitis 11/28/2020   Mild intermittent asthma, uncomplicated 11/28/2020   Anaphylactic shock due to adverse food reaction 11/28/2020    Degenerative cervical disc 03/25/2020   Nonallopathic lesion of cervical region 03/25/2020   Nonallopathic lesion of thoracic region 03/25/2020   Nonallopathic lesion of rib cage 03/25/2020   Seronegative rheumatoid arthritis (HCC) 03/25/2020   Adjustment insomnia 03/20/2018   Recurrent major depressive disorder, in full remission (HCC) 03/20/2018   Depressive disorder 07/24/2017   Hyperinsulinism 07/24/2017   Nausea 07/24/2017    Past Surgical History:  Procedure Laterality Date   VASECTOMY  2010    Family History  Problem Relation Age of Onset   Hypotension Mother    Atrial fibrillation Mother    Other Father        joint pain, undiagnosed   Prostate cancer Father 73   Bone cancer Father        mets from prostate cancer   Arthritis Father    Cancer Father    Rheum arthritis Brother 76   Lupus Paternal Aunt    Heart disease Maternal Grandmother        valve replacement   Heart attack Maternal Grandfather 40   Esophageal cancer Paternal Grandmother    Heart disease Paternal Grandmother    Alcohol abuse Paternal Grandfather    Emphysema Paternal Grandfather    Colon polyps Neg Hx    Colon cancer Neg Hx    Stomach cancer Neg Hx    Rectal cancer Neg Hx     Medications- reviewed and updated Outpatient Medications Prior to Visit  Medication Sig Dispense Refill   albuterol  (VENTOLIN  HFA) 108 (90 Base) MCG/ACT inhaler Inhale 1-2 puffs into the lungs every 6 (six) hours as needed for wheezing or shortness of breath. 18 g 0   azelastine  (ASTELIN ) 0.1 % nasal spray Place 2 sprays into both nostrils 2 (two) times daily. 30 mL 12   Cetirizine HCl (ZYRTEC ALLERGY PO) Take by mouth.     Cholecalciferol (VITAMIN D3 PO) Take by mouth daily.     EPINEPHrine  0.3 mg/0.3 mL IJ SOAJ injection Inject 0.3 mg into the muscle as needed for anaphylaxis. 2 each 1   fluticasone  (FLONASE ) 50 MCG/ACT nasal spray Place 2 sprays into both nostrils daily. 16 g 6   QUERCETIN PO Take by mouth daily.      tiZANidine  (ZANAFLEX ) 2 MG tablet Take 1 tablet (2 mg total) by mouth at bedtime. 30 tablet 0   traZODone  (DESYREL ) 50 MG tablet TAKE 1 TABLET BY MOUTH EVERYDAY AT BEDTIME 90 tablet 1   Vitamin D , Ergocalciferol , (DRISDOL ) 1.25 MG (50000 UNIT) CAPS capsule Take 1 capsule (50,000 Units total) by mouth every 7 (seven) days. 5 capsule 0   B Complex Vitamins (B COMPLEX PO) Take by mouth daily.     montelukast  (SINGULAIR ) 10 MG tablet Take 1 tablet (10 mg total) by mouth at bedtime. (Patient not taking: Reported on 11/09/2023) 30 tablet 3   predniSONE  (DELTASONE ) 50 MG tablet Take 1 tablet (50 mg total)  by mouth daily with breakfast. (Patient not taking: Reported on 11/09/2023) 5 tablet 0   promethazine -dextromethorphan (PROMETHAZINE -DM) 6.25-15 MG/5ML syrup Take 5 mLs by mouth 3 (three) times daily as needed for cough. (Patient not taking: Reported on 11/09/2023) 200 mL 0   No facility-administered medications prior to visit.    Allergies  Allergen Reactions   Peanut-Containing Drug Products Anaphylaxis    peanut allergenic extract   Soy Protein Anaphylaxis    soy   Corn-Containing Products    Egg-Derived Products    Gabapentin    Soy Allergy (Obsolete)    Sulfasalazine    Wheat     Social History   Socioeconomic History   Marital status: Married    Spouse name: Not on file   Number of children: Not on file   Years of education: Not on file   Highest education level: Not on file  Occupational History   Not on file  Tobacco Use   Smoking status: Former    Current packs/day: 0.00    Average packs/day: 1 pack/day for 21.0 years (21.0 ttl pk-yrs)    Types: Cigarettes    Start date: 72    Quit date: 06/27/2017    Years since quitting: 6.3    Passive exposure: Never   Smokeless tobacco: Never  Vaping Use   Vaping status: Never Used  Substance and Sexual Activity   Alcohol use: Not Currently   Drug use: Not Currently   Sexual activity: Yes    Birth control/protection:  Surgical  Other Topics Concern   Not on file  Social History Narrative   Not on file   Social Drivers of Health   Financial Resource Strain: Not on file  Food Insecurity: Not on file  Transportation Needs: Not on file  Physical Activity: Not on file  Stress: Not on file  Social Connections: Not on file           Objective:  Physical Exam: BP 105/74 (BP Location: Left Arm, Patient Position: Sitting, Cuff Size: Normal)   Pulse 64   Temp 97.8 F (36.6 C) (Temporal)   Resp 18   Ht 5\' 11"  (1.803 m)   Wt 199 lb 12.8 oz (90.6 kg)   SpO2 97%   BMI 27.87 kg/m   Body mass index is 27.87 kg/m. Wt Readings from Last 3 Encounters:  11/09/23 199 lb 12.8 oz (90.6 kg)  10/30/23 206 lb (93.4 kg)  11/01/22 200 lb (90.7 kg)   ECG shows sinus bradycardia with mild changes in R wave progression  Physical Exam Constitutional:      General: He is not in acute distress.    Appearance: Normal appearance. He is not ill-appearing or toxic-appearing.  HENT:     Head: Normocephalic and atraumatic.     Right Ear: Hearing, tympanic membrane, ear canal and external ear normal. There is no impacted cerumen.     Left Ear: Hearing, tympanic membrane, ear canal and external ear normal. There is no impacted cerumen.     Nose: Nose normal. No congestion.     Mouth/Throat:     Lips: No lesions.     Mouth: Mucous membranes are moist.     Pharynx: Oropharynx is clear. No oropharyngeal exudate.  Eyes:     General: No scleral icterus.       Right eye: No discharge.        Left eye: No discharge.     Conjunctiva/sclera: Conjunctivae normal.     Pupils: Pupils are  equal, round, and reactive to light.  Neck:     Thyroid : No thyroid  mass, thyromegaly or thyroid  tenderness.  Cardiovascular:     Rate and Rhythm: Normal rate and regular rhythm.     Pulses: Normal pulses.     Heart sounds: Normal heart sounds.  Pulmonary:     Effort: Pulmonary effort is normal. No respiratory distress.     Breath  sounds: Normal breath sounds.  Abdominal:     General: Abdomen is flat. Bowel sounds are normal.     Palpations: Abdomen is soft.  Musculoskeletal:        General: Normal range of motion.     Cervical back: Normal range of motion.     Right lower leg: No edema.     Left lower leg: No edema.  Lymphadenopathy:     Cervical: No cervical adenopathy.  Skin:    General: Skin is warm and dry.     Findings: No rash.  Neurological:     General: No focal deficit present.     Mental Status: He is alert and oriented to person, place, and time. Mental status is at baseline.     Deep Tendon Reflexes:     Reflex Scores:      Patellar reflexes are 2+ on the right side and 2+ on the left side. Psychiatric:        Mood and Affect: Mood normal.        Behavior: Behavior normal.        Thought Content: Thought content normal.        Judgment: Judgment normal.         Prior labs:   No results found for this or any previous visit (from the past 2160 hours).  Lab Results  Component Value Date   CHOL 172 11/17/2021   Lab Results  Component Value Date   HDL 51 11/17/2021   Lab Results  Component Value Date   LDLCALC 93 11/17/2021   Lab Results  Component Value Date   TRIG 188 (H) 11/17/2021   Lab Results  Component Value Date   CHOLHDL 3.4 11/17/2021   No results found for: "LDLDIRECT"  Last metabolic panel Lab Results  Component Value Date   GLUCOSE 75 08/26/2022   NA 144 08/26/2022   K 5.0 08/26/2022   CL 103 08/26/2022   CO2 25 08/26/2022   BUN 20 08/26/2022   CREATININE 1.16 08/26/2022   GFR 75.06 08/26/2022   CALCIUM 10.0 08/26/2022   PROT 6.9 08/26/2022   ALBUMIN 4.5 08/26/2022   BILITOT 0.6 08/26/2022   ALKPHOS 64 08/26/2022   AST 21 08/26/2022   ALT 28 08/26/2022    Lab Results  Component Value Date   HGBA1C 5.3 08/26/2022    Last CBC Lab Results  Component Value Date   WBC 5.8 08/26/2022   HGB 15.9 08/26/2022   HCT 47.1 08/26/2022   MCV 94.8  08/26/2022   RDW 12.4 08/26/2022   PLT 144.0 (L) 08/26/2022    Lab Results  Component Value Date   TSH 1.32 08/26/2022    Lab Results  Component Value Date   PSA 0.40 08/26/2022   PSA 0.35 11/17/2021   PSA 0.50 09/24/2020    Last vitamin D  Lab Results  Component Value Date   VD25OH 13.50 (L) 08/26/2022    Lab Results  Component Value Date   PROTEINUR NEGATIVE 08/26/2022    No results found for: "LABMICR", "MICROALBUR"   At today's visit, we  discussed treatment options, associated risk and benefits, and engage in counseling as needed.  Additionally the following were reviewed: Past medical records, past medical and surgical history, family and social background, as well as relevant laboratory results, imaging findings, and specialty notes, where applicable.  This message was generated using dictation software, and as a result, it may contain unintentional typos or errors.  Nevertheless, extensive effort was made to accurately convey at the pertinent aspects of the patient visit.    There may have been are other unrelated non-urgent complaints, but due to the busy schedule and the amount of time already spent with him, time does not permit to address these issues at today's visit. Another appointment may have or has been requested to review these additional issues.     Harle Libra, MD, MS

## 2023-11-10 LAB — COMPLETE METABOLIC PANEL WITHOUT GFR
AG Ratio: 1.9 (calc) (ref 1.0–2.5)
ALT: 22 U/L (ref 9–46)
AST: 19 U/L (ref 10–40)
Albumin: 4.6 g/dL (ref 3.6–5.1)
Alkaline phosphatase (APISO): 76 U/L (ref 36–130)
BUN: 12 mg/dL (ref 7–25)
CO2: 29 mmol/L (ref 20–32)
Calcium: 9.9 mg/dL (ref 8.6–10.3)
Chloride: 102 mmol/L (ref 98–110)
Creat: 1.02 mg/dL (ref 0.60–1.29)
Globulin: 2.4 g/dL (ref 1.9–3.7)
Glucose, Bld: 81 mg/dL (ref 65–99)
Potassium: 4.6 mmol/L (ref 3.5–5.3)
Sodium: 141 mmol/L (ref 135–146)
Total Bilirubin: 1.3 mg/dL — ABNORMAL HIGH (ref 0.2–1.2)
Total Protein: 7 g/dL (ref 6.1–8.1)

## 2023-11-10 LAB — LIPID PANEL
Cholesterol: 183 mg/dL (ref 0–200)
HDL: 41.7 mg/dL (ref >39.00)
LDL Cholesterol: 101 mg/dL — ABNORMAL HIGH (ref 0–99)
NonHDL: 141.73
Total CHOL/HDL Ratio: 4
Triglycerides: 202 mg/dL — ABNORMAL HIGH (ref 0.0–149.0)
VLDL: 40.4 mg/dL — ABNORMAL HIGH (ref 0.0–40.0)

## 2023-11-10 LAB — CBC
HCT: 47.2 % (ref 39.0–52.0)
Hemoglobin: 15.8 g/dL (ref 13.0–17.0)
MCHC: 33.4 g/dL (ref 30.0–36.0)
MCV: 93.1 fl (ref 78.0–100.0)
Platelets: 152 10*3/uL (ref 150.0–400.0)
RBC: 5.07 Mil/uL (ref 4.22–5.81)
RDW: 12.5 % (ref 11.5–15.5)
WBC: 8.4 10*3/uL (ref 4.0–10.5)

## 2023-11-10 LAB — URINALYSIS W MICROSCOPIC + REFLEX CULTURE
Bacteria, UA: NONE SEEN /HPF
Bilirubin Urine: NEGATIVE
Glucose, UA: NEGATIVE
Hgb urine dipstick: NEGATIVE
Hyaline Cast: NONE SEEN /LPF
Leukocyte Esterase: NEGATIVE
Nitrites, Initial: NEGATIVE
Protein, ur: NEGATIVE
RBC / HPF: NONE SEEN /HPF (ref 0–2)
Specific Gravity, Urine: 1.011 (ref 1.001–1.035)
Squamous Epithelial / HPF: NONE SEEN /HPF (ref <=5)
WBC, UA: NONE SEEN /HPF (ref 0–5)
pH: 7 (ref 5.0–8.0)

## 2023-11-10 LAB — SEDIMENTATION RATE: Sed Rate: 10 mm/h (ref 0–15)

## 2023-11-10 LAB — THYROID PANEL WITH TSH
Free Thyroxine Index: 2.6 (ref 1.4–3.8)
T3 Uptake: 31 % (ref 22–35)
T4, Total: 8.3 ug/dL (ref 4.9–10.5)
TSH: 1.14 m[IU]/L (ref 0.40–4.50)

## 2023-11-10 LAB — C-REACTIVE PROTEIN: CRP: <1 mg/dL (ref 0.5–20.0)

## 2023-11-10 LAB — HIV ANTIBODY (ROUTINE TESTING W REFLEX): HIV 1&2 Ab, 4th Generation: NONREACTIVE

## 2023-11-10 LAB — HEMOGLOBIN A1C: Hgb A1c MFr Bld: 5.3 % (ref 4.6–6.5)

## 2023-11-10 LAB — HEPATITIS C ANTIBODY: Hepatitis C Ab: NONREACTIVE

## 2023-11-10 LAB — NO CULTURE INDICATED

## 2023-11-14 ENCOUNTER — Ambulatory Visit: Payer: Self-pay | Admitting: Family Medicine

## 2023-11-14 DIAGNOSIS — E559 Vitamin D deficiency, unspecified: Secondary | ICD-10-CM | POA: Insufficient documentation

## 2023-11-14 LAB — PSA: PSA: 0.4 ng/mL (ref 0.10–4.00)

## 2023-11-14 LAB — VITAMIN D 25 HYDROXY (VIT D DEFICIENCY, FRACTURES): VITD: 23.55 ng/mL — ABNORMAL LOW (ref 30.00–100.00)

## 2023-11-14 MED ORDER — VITAMIN D (ERGOCALCIFEROL) 1.25 MG (50000 UNIT) PO CAPS
50000.0000 [IU] | ORAL_CAPSULE | ORAL | 3 refills | Status: AC
Start: 2023-11-14 — End: 2024-11-13

## 2023-11-24 ENCOUNTER — Ambulatory Visit: Admitting: Family Medicine

## 2023-12-12 NOTE — Progress Notes (Signed)
 [facility] [address] [place] [address] Phone: [phone] Subjective:   IBryan Jordan, am serving as a scribe for Dr. Ronnell Coins.  I'm seeing this patient by the request  of:  Catheryn Cluck, MD  CC: Neck pain follow-up  [id number]  Darrell Jordan is a 48 y.o. male coming in with complaint of back and neck pain. OMT on 10/30/2023. Patient states doing okay.  Has had noted some chest discomfort. Primary care.  Awaiting echocardiogram.  Continues to have the heaviness feeling from time to time.  Family history significant for grandfather died in 33s from massive MI.  Mother has atrial fibrillation.          Reviewed prior external information including notes and imaging from previsou exam, outside providers and external EMR if available.   As well as notes that were available from care everywhere and other healthcare systems.  Past medical history, social, surgical and family history all reviewed in electronic medical record.  No pertanent information unless stated regarding to the chief complaint.   Past Medical History:  Diagnosis Date   Allergy 1980   Depression 1990   Mild intermittent asthma, uncomplicated 11/28/2020   Rheumatoid arthritis (HCC)     Allergies  Allergen Reactions   Peanut-Containing Drug Products Anaphylaxis    peanut allergenic extract   Soy Protein Anaphylaxis    soy   Corn-Containing Products    Egg-Derived Products    Gabapentin    Soy Allergy (Obsolete)    Sulfasalazine    Wheat      Review of Systems:  No  visual changes, nausea, vomiting, diarrhea, constipation, dizziness, abdominal pain, skin rash, fevers, chills, night sweats, weight loss, swollen lymph nodes, body aches, joint swelling, chest pain, shortness of breath, mood changes. POSITIVE muscle aches, headache, chest heaviness but not pain  Objective  Blood pressure 124/74, pulse 72, height 5' 11 (1.803 m), weight 206 lb (93.4 kg),  SpO2 97%.   General: No apparent distress alert and oriented x3 mood and affect normal, dressed appropriately.  HEENT: Pupils equal, extraocular movements intact  Respiratory: Patient's speak in full sentences and does not appear short of breath  Cardiovascular: No lower extremity edema, non tender, no erythema  MSK:  Back does have some mild loss of lordosis.  Significant tightness noted in the neck still.  Some limited sidebending.  Osteopathic findings  C2 flexed rotated and side bent right C6 flexed rotated and side bent left C7 flexed rotated and side bent left T3 extended rotated and side bent right inhaled rib T9 extended rotated and side bent left L2 flexed rotated and side bent right L3 flexed rotated and side bent right Sacrum right on right       Assessment and Plan:  Degenerative cervical disc Degenerative disc known mild exacerbation.  Discussed icing regimen of home exercises, which activities to do in the past has been able to be more active initially and was feeling better but now has been out of the routine.  Looking forward to starting to do so but would not do it safely.  Given some return to progression.  Palpitations Has had difficulty referral to cardiology for further evaluation.  Is awaiting an echocardiogram scheduled for next week    Nonallopathic problems  Decision today to treat with OMT was based on Physical Exam  After verbal consent patient was treated with HVLA, ME, FPR techniques in cervical, rib, thoracic, lumbar, and sacral  areas  Patient tolerated the procedure well with improvement in symptoms  Patient given exercises, stretches and lifestyle modifications  See medications in patient instructions if given  Patient will follow up in 4-8 weeks     The above documentation has been reviewed and is accurate and complete Harlean Regula M Tehillah Cipriani, DO         Note: This dictation was prepared with Dragon dictation along with smaller phrase  technology. Any transcriptional errors that result from this process are unintentional.

## 2023-12-13 ENCOUNTER — Encounter: Payer: Self-pay | Admitting: Family Medicine

## 2023-12-13 ENCOUNTER — Ambulatory Visit: Admitting: Family Medicine

## 2023-12-13 VITALS — BP 124/74 | HR 72 | Ht 71.0 in | Wt 206.0 lb

## 2023-12-13 DIAGNOSIS — M9908 Segmental and somatic dysfunction of rib cage: Secondary | ICD-10-CM

## 2023-12-13 DIAGNOSIS — M9901 Segmental and somatic dysfunction of cervical region: Secondary | ICD-10-CM

## 2023-12-13 DIAGNOSIS — M9904 Segmental and somatic dysfunction of sacral region: Secondary | ICD-10-CM

## 2023-12-13 DIAGNOSIS — M9903 Segmental and somatic dysfunction of lumbar region: Secondary | ICD-10-CM

## 2023-12-13 DIAGNOSIS — M9902 Segmental and somatic dysfunction of thoracic region: Secondary | ICD-10-CM

## 2023-12-13 DIAGNOSIS — R002 Palpitations: Secondary | ICD-10-CM

## 2023-12-13 DIAGNOSIS — M5030 Other cervical disc degeneration, unspecified cervical region: Secondary | ICD-10-CM

## 2023-12-13 NOTE — Assessment & Plan Note (Signed)
 Has had difficulty referral to cardiology for further evaluation.  Is awaiting an echocardiogram scheduled for next week

## 2023-12-13 NOTE — Patient Instructions (Signed)
 Cardio referral Okay to start working out 50% duration or intensity up to 3x a week increase 10% a week See you again in 2 months

## 2023-12-13 NOTE — Assessment & Plan Note (Signed)
 Degenerative disc known mild exacerbation.  Discussed icing regimen of home exercises, which activities to do in the past has been able to be more active initially and was feeling better but now has been out of the routine.  Looking forward to starting to do so but would not do it safely.  Given some return to progression.

## 2023-12-21 ENCOUNTER — Ambulatory Visit (HOSPITAL_COMMUNITY)
Admission: RE | Admit: 2023-12-21 | Discharge: 2023-12-21 | Disposition: A | Discharge status: Home / Self Care | Source: Ambulatory Visit | Attending: Cardiovascular Disease | Admitting: Cardiovascular Disease

## 2023-12-21 DIAGNOSIS — R002 Palpitations: Secondary | ICD-10-CM | POA: Diagnosis not present

## 2023-12-21 DIAGNOSIS — R072 Precordial pain: Secondary | ICD-10-CM | POA: Diagnosis not present

## 2023-12-21 LAB — ECHOCARDIOGRAM COMPLETE
Area-P 1/2: 3.6 cm2
S' Lateral: 2.9 cm

## 2023-12-22 ENCOUNTER — Encounter: Payer: Self-pay | Admitting: Family Medicine

## 2024-02-19 NOTE — Progress Notes (Unsigned)
 Darrell Jordan [facility]rell [facility] [address] [place] [address] Phone: [phone] Subjective:   Darrell Jordan, am serving as a scribe for Dr. Arthea Jordan.  I'm seeing this patient by the request  of:  Darrell Beverley NOVAK, MD  CC: Back and neck pain follow-up Headaches and neck pain follow-up YEP:Dlagzrupcz  Darrell Jordan is a 48 y.o. male coming in with complaint of back and neck pain. OMT on 12/13/2023. Patient states that he has a couple of pinched nerves in neck and L scapula but pain has gone away today. Neck has been a little off today due to pain yesterday.   Medications patient has been prescribed:   Taking:         Reviewed prior external information including notes and imaging from previsou exam, outside providers and external EMR if available.   As well as notes that were available from care everywhere and other healthcare systems.  Past medical history, social, surgical and family history all reviewed in electronic medical record.  No pertanent information unless stated regarding to the chief complaint.   Past Medical History:  Diagnosis Date   Allergy 1980   Depression 1990   Mild intermittent asthma, uncomplicated 11/28/2020   Rheumatoid arthritis (HCC)     Allergies  Allergen Reactions   Peanut-Containing Drug Products Anaphylaxis    peanut allergenic extract   Soy Protein Anaphylaxis    soy   Corn-Containing Products    Egg-Derived Products    Gabapentin    Soy Allergy (Obsolete)    Sulfasalazine    Wheat      Review of Systems:  No headache, visual changes, nausea, vomiting, diarrhea, constipation, dizziness, abdominal pain, skin rash, fevers, chills, night sweats, weight loss, swollen lymph nodes, body aches, joint swelling, chest pain, shortness of breath, mood changes. POSITIVE muscle aches  Objective  Blood pressure 106/72, pulse 80, height 5' 11 (1.803 m), weight 199 lb (90.3 kg), SpO2 97%.   General: No apparent  distress alert and oriented x3 mood and affect normal, dressed appropriately.  HEENT: Pupils equal, extraocular movements intact  Respiratory: Patient's speak in full sentences and does not appear short of breath  Cardiovascular: No lower extremity edema, non tender, no erythema  Neck seems to be tighter than usual.  Seems to have some difficulty with left-sided extension and rotation.  Difficulty with left-sided sidebending as well.  Osteopathic findings  C2 flexed rotated and side bent right C4 flexed rotated and side bent left C7 flexed rotated and side bent left T3 extended rotated and side bent right inhaled rib T8 extended rotated and side bent left L1 flexed rotated and side bent right Sacrum right on right       Assessment and Plan:  Degenerative cervical disc Patient has been doing very well with working out and being stronger.  Unfortunately has had exacerbation recently.  Discussed icing regimen and home exercises, discussed which activities to do and which ones to avoid.  Increase activities follow-up again in 6 to 8 weeks.  No change in medications at this time.  Worsening pain will need to seek medical attention.    Nonallopathic problems  Decision today to treat with OMT was based on Physical Exam  After verbal consent patient was treated with HVLA, ME, FPR techniques in cervical, rib, thoracic, lumbar, and sacral  areas  Patient tolerated the procedure well with improvement in symptoms  Patient given exercises, stretches and lifestyle modifications  See medications in patient instructions  if given  Patient will follow up in 4-8 weeks     The above documentation has been reviewed and is accurate and complete Darrell Jordan M Darrell [place], DO         Note: This dictation was prepared with Dragon dictation along with smaller phrase technology. Any transcriptional errors that result from this process are unintentional.

## 2024-02-21 ENCOUNTER — Ambulatory Visit: Admitting: Family Medicine

## 2024-02-22 ENCOUNTER — Ambulatory Visit: Admitting: Family Medicine

## 2024-02-22 ENCOUNTER — Encounter: Payer: Self-pay | Admitting: Family Medicine

## 2024-02-22 VITALS — BP 106/72 | HR 80 | Ht 71.0 in | Wt 199.0 lb

## 2024-02-22 DIAGNOSIS — M9904 Segmental and somatic dysfunction of sacral region: Secondary | ICD-10-CM

## 2024-02-22 DIAGNOSIS — M5030 Other cervical disc degeneration, unspecified cervical region: Secondary | ICD-10-CM

## 2024-02-22 DIAGNOSIS — M9908 Segmental and somatic dysfunction of rib cage: Secondary | ICD-10-CM

## 2024-02-22 DIAGNOSIS — M9903 Segmental and somatic dysfunction of lumbar region: Secondary | ICD-10-CM

## 2024-02-22 DIAGNOSIS — M9901 Segmental and somatic dysfunction of cervical region: Secondary | ICD-10-CM | POA: Diagnosis not present

## 2024-02-22 DIAGNOSIS — M9902 Segmental and somatic dysfunction of thoracic region: Secondary | ICD-10-CM

## 2024-02-22 NOTE — Patient Instructions (Signed)
 Great to see you Try to get back into routine See me in 2 months

## 2024-02-22 NOTE — Assessment & Plan Note (Signed)
 Patient has been doing very well with working out and being stronger.  Unfortunately has had exacerbation recently.  Discussed icing regimen and home exercises, discussed which activities to do and which ones to avoid.  Increase activities follow-up again in 6 to 8 weeks.  No change in medications at this time.  Worsening pain will need to seek medical attention.

## 2024-03-29 ENCOUNTER — Other Ambulatory Visit: Payer: Self-pay | Admitting: Family Medicine

## 2024-03-29 DIAGNOSIS — J302 Other seasonal allergic rhinitis: Secondary | ICD-10-CM

## 2024-04-25 IMAGING — MR MR SHOULDER*L* W/ CM
4 of 8 series · 13 of 40 positions shown · IV contrast (agent unspecified)
Comparison: None Available.

CLINICAL DATA: Left shoulder pain

EXAM:
MRI OF THE LEFT SHOULDER WITH CONTRAST
TECHNIQUE: Multiplanar, multisequence MR imaging of the left shoulder was
performed following the administration of intra-articular contrast.
CONTRAST:  See Injection Documentation.

[Series 7: T1 fat-sat · axial · left · 3.0mm · 0.36mm/px · z∈[-13,+65]mm · 3 of 28 slices shown (1 of 2)]
[im 6/28]
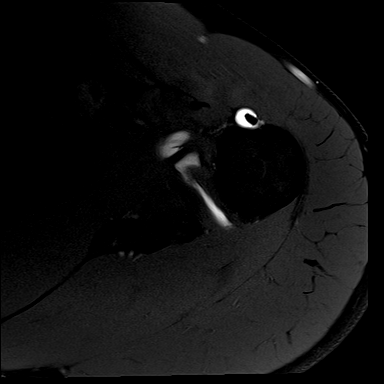
[im 17/28]
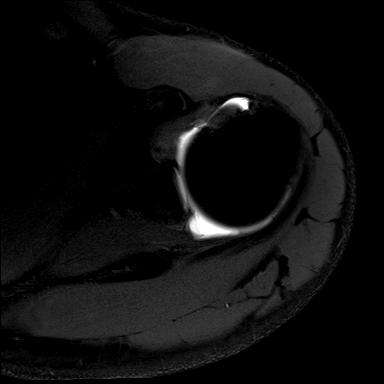
[im 28/28]
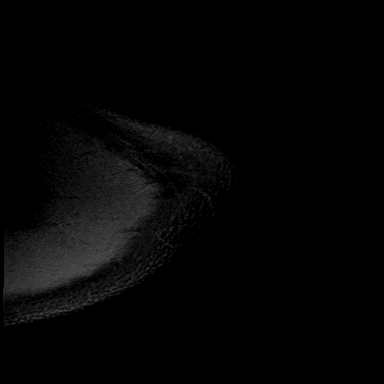

[Series 8: T2 fat-sat · oblique · left · 3.0mm · 0.22mm/px · 4 of 27 slices shown (1 of 2)]
[im 1/27]
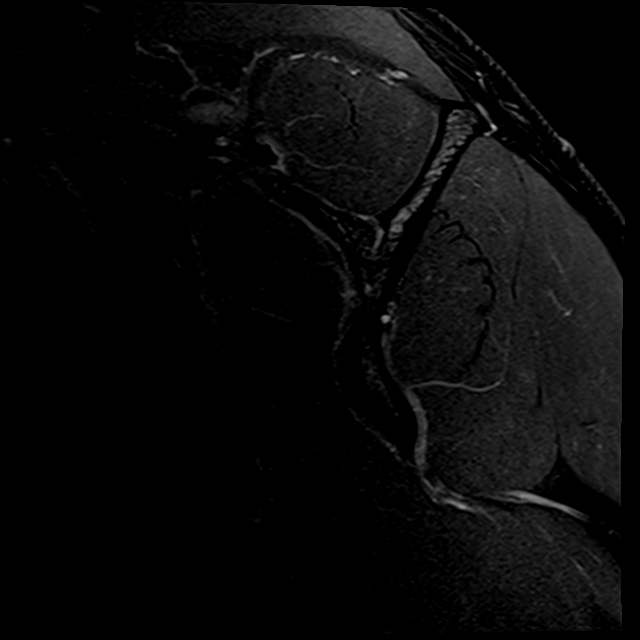
[im 7/27]
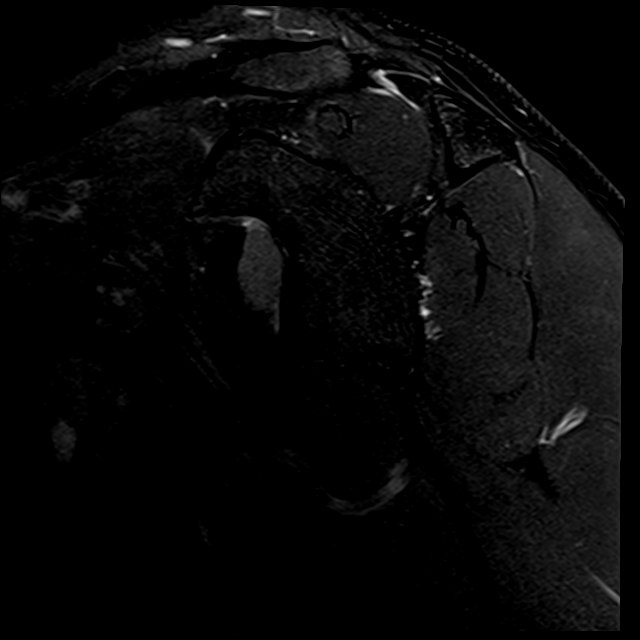
[im 14/27]
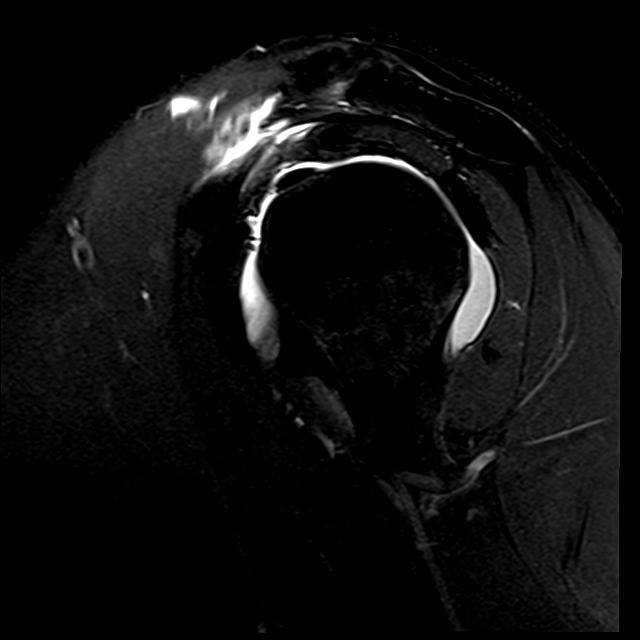
[im 27/27]
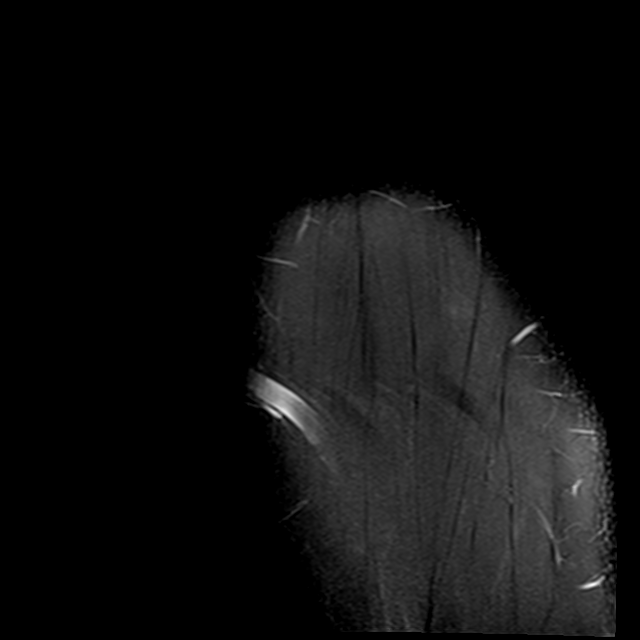

[Series 9: T1 fat-sat · oblique · left · 3.0mm · 0.20mm/px · 3 of 24 slices shown (2 of 2)]
[im 1/24]
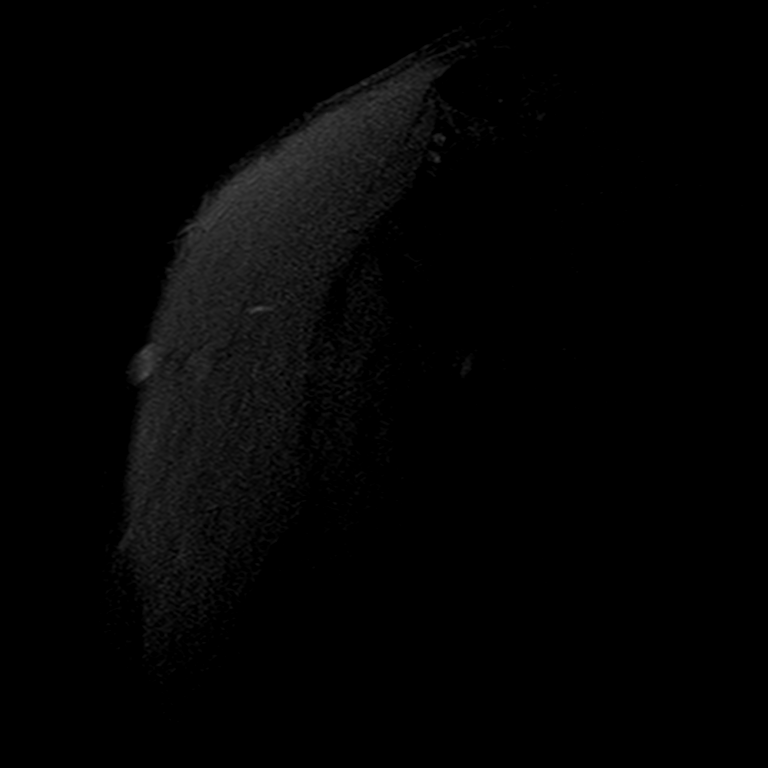
[im 12/24]
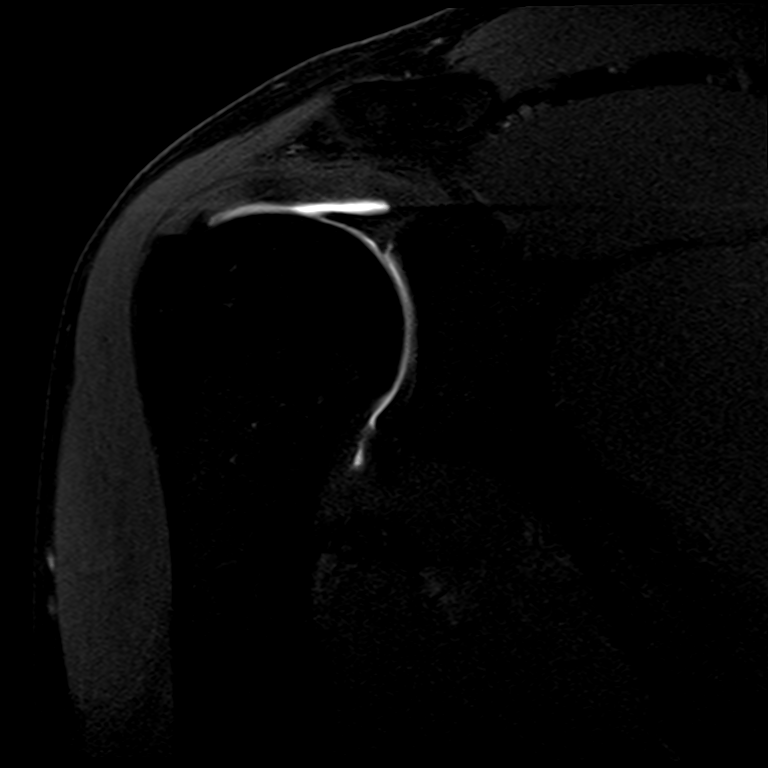
[im 24/24]
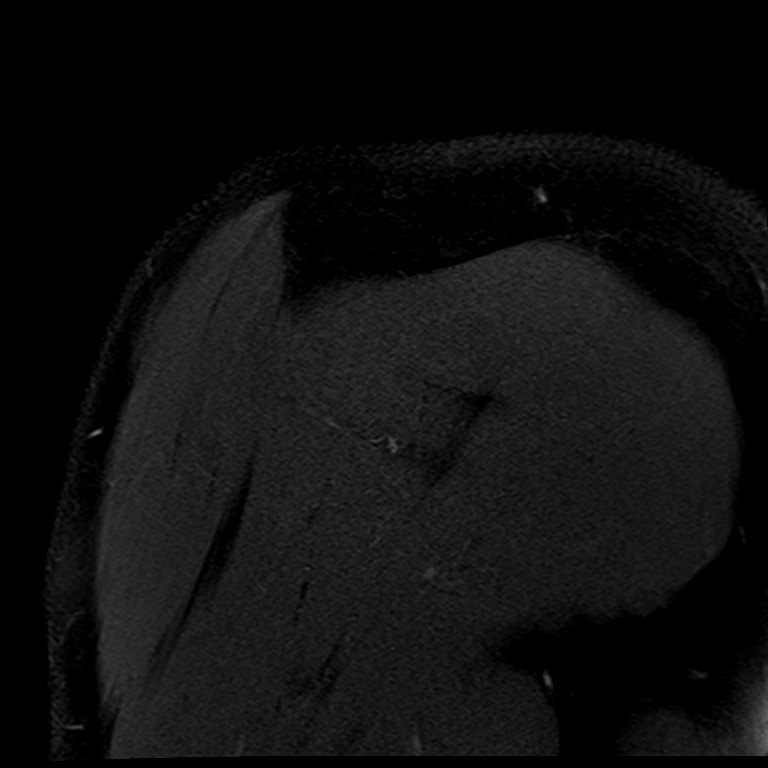

[Series 11: T2 fat-sat · oblique · left · 3.0mm · 0.23mm/px · 3 of 24 slices shown (2 of 2)]
[im 1/24]
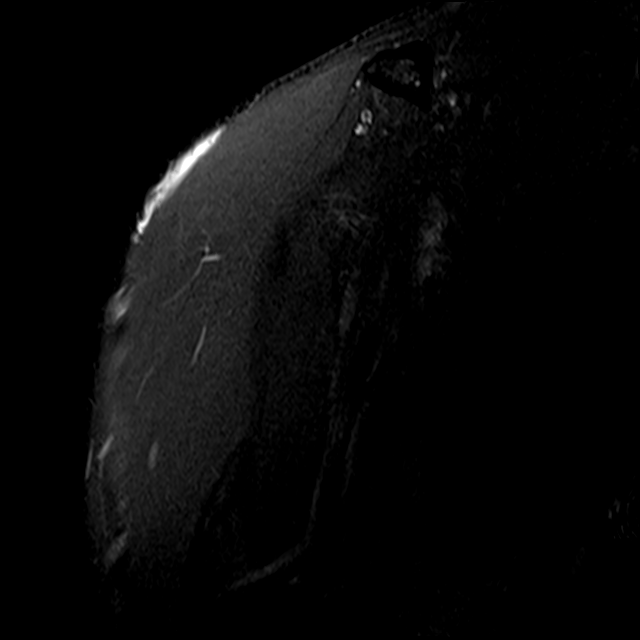
[im 12/24]
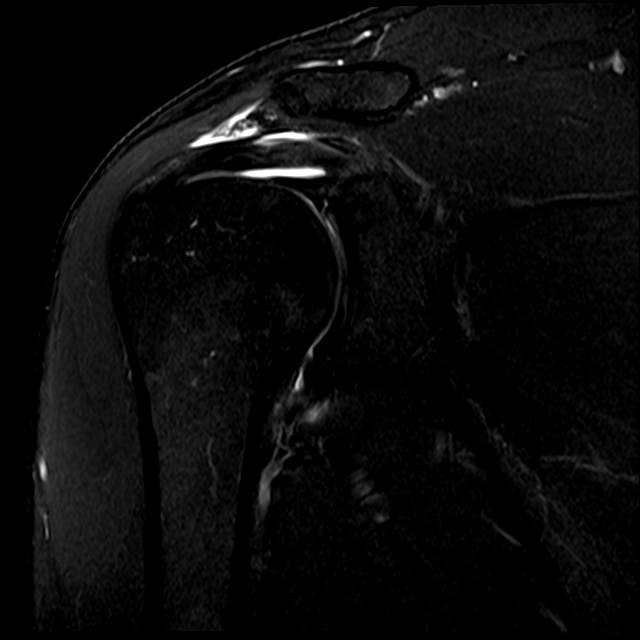
[im 24/24]
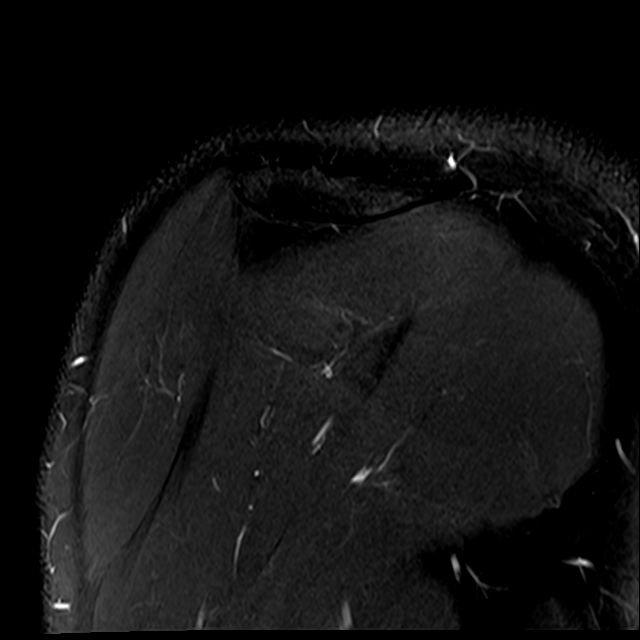

[13 of 40 positions shown; findings below may reference images not displayed]

FINDINGS: Rotator cuff: Mild distal supraspinatus tendinosis. Infraspinatus
tendon is intact. Teres minor tendon is intact. Subscapularis tendon
is intact.

Muscles: No significant muscle atrophy.

Biceps Long Head: Intraarticular and extraarticular portions of the
biceps tendon are intact.

Acromioclavicular Joint: No significant arthropathy of the
acromioclavicular joint. No significant subacromial/subdeltoid
bursal fluid.

Glenohumeral Joint: Adequatedistension of the glenohumeral joint. No
chondral defect

Labrum: There is smooth undercutting the superior labrum from
[DATE].

Bones: No fracture or dislocation. No aggressive osseous lesion.

Other: No fluid collection or hematoma.
IMPRESSION: Smooth undercutting of the superior labrum from [DATE], favored
to represent a normal variant sublabral recess. No definite labral
tear.

Mild distal supraspinatus tendinosis. No high-grade or retracted
cuff tear. No muscle atrophy.

Intact long head biceps tendon.  No subacromial-subdeltoid bursitis.

## 2024-05-16 NOTE — Progress Notes (Signed)
 Cardiology Clinic Note   Patient Name: Darrell Jordan Date of Encounter: 05/20/2024  Primary Care Provider:  Sebastian Beverley NOVAK, MD Primary Cardiologist:  None  Patient Profile    Darrell Jordan 48 year old male presents to the clinic today to establish care and for evaluation of his palpitations and chest pain.  Past Medical History    Past Medical History:  Diagnosis Date   Allergy 1980   Depression 1990   Mild intermittent asthma, uncomplicated 11/28/2020   Rheumatoid arthritis (HCC)    Past Surgical History:  Procedure Laterality Date   VASECTOMY  2010    Allergies  Allergies  Allergen Reactions   Peanut-Containing Drug Products Anaphylaxis    peanut allergenic extract   Soy Protein Anaphylaxis    soy   Gabapentin    Soy Allergy (Obsolete)    Sulfasalazine    Wheat     History of Present Illness    Darrell Jordan has a PMH of chronic rhinitis, and intermittent mild asthma, depression, nausea, hyperlipidemia, palpitations, and chronic fatigue.  He reports family history of cardiovascular disease in his maternal grandmother having coronary artery disease and valve replacement.  His mom had atrial fibrillation.  He reports that his father side had trouble with alcohol abuse and tobacco abuse.SABRA  He was referred by his PCP for evaluation of palpitations and chest pain.  He was seen and evaluated by his PCP on 11/09/2023.  He reported chest pain and palpitations over the prior month.  He noted pain in the center of his chest.  He described the pain as pressure with occasional fluttering and sensation radiating to his neck.  He had no associated dyspnea and denied exertional triggers.  He did report a family history of early MI.  Echocardiogram was ordered and showed normal LVEF, no wall motion abnormality, and no valvular abnormalities.  Calcium scoring and cardiac event monitor were also recommended but not completed.  He was noted to have tenderness in his chest which was felt to  be related to possible costochondritis.  Discussion for referring to cardiology was reviewed.  Inflammatory markers ESR CRP were within normal limits.  He was referred to cardiology for further evaluation.  He follows a plant-based diet.  He presents to the clinic today for evaluation and states he intermittently feels a heart flutter that is about 1 time per month.  He reports that he started noticing discomfort in the spring.  He had echocardiogram 12/21/2023 which showed small pericardial effusion.  He reports that his pressure feels worse with slight decline such as sitting on the couch.  He does exercise regularly.  We reviewed options for further prognostication.  He also notes that he has a autoimmune disorder.  He was previously worked up for RA and referred to rheumatology.  Rheumatology did not feel that his symptoms were related to RA.  He stopped treatment in 2019.  His symptoms have been much better with strict diet.  He avoids soy, peanuts and reports that he recently had an allergic reaction to sesame oil.  He regularly takes Zyrtec.    I will order repeat echocardiogram, CBC, BMP, and magnesium.  Previously his TSH was normal.  We will plan follow-up in 1 month.  Today he  lower extremity edema, fatigue, palpitations, melena, hematuria, hemoptysis, diaphoresis, weakness, presyncope, syncope, orthopnea, and PND.   Home Medications    Prior to Admission medications   Medication Sig Start Date End Date Taking? Authorizing Provider  albuterol  (VENTOLIN  HFA)  108 (90 Base) MCG/ACT inhaler Inhale 1-2 puffs into the lungs every 6 (six) hours as needed for wheezing or shortness of breath. 06/13/23   Christopher Savannah, PA-C  azelastine  (ASTELIN ) 0.1 % nasal spray Place 2 sprays into both nostrils 2 (two) times daily. 08/26/22   Sebastian Beverley NOVAK, MD  Cetirizine HCl (ZYRTEC ALLERGY PO) Take by mouth.    [provider]  Cholecalciferol (VITAMIN D3 PO) Take by mouth daily.    [provider]  EPINEPHrine  0.3 mg/0.3 mL IJ SOAJ injection Inject 0.3 mg into the muscle as needed for anaphylaxis. 11/18/21   Iva Marty Saltness, MD  fluticasone  (FLONASE ) 50 MCG/ACT nasal spray Place 2 sprays into both nostrils daily. 08/26/22   Sebastian Beverley NOVAK, MD  QUERCETIN PO Take by mouth daily.    [provider]  tiZANidine  (ZANAFLEX ) 2 MG tablet Take 1 tablet (2 mg total) by mouth at bedtime. 10/30/23   Claudene Arthea HERO, DO  traZODone  (DESYREL ) 50 MG tablet TAKE 1 TABLET BY MOUTH EVERYDAY AT BEDTIME 04/01/24   Sebastian Beverley NOVAK, MD  Vitamin D , Ergocalciferol , (DRISDOL ) 1.25 MG (50000 UNIT) CAPS capsule Take 1 capsule (50,000 Units total) by mouth every 7 (seven) days. 11/14/23 11/13/24  Sebastian Beverley NOVAK, MD    Family History    Family History  Problem Relation Age of Onset   Hypotension Mother    Atrial fibrillation Mother    Other Father        joint pain, undiagnosed   Prostate cancer Father 67   Bone cancer Father        mets from prostate cancer   Arthritis Father    Cancer Father    Rheum arthritis Brother 26   Lupus Paternal Aunt    Heart disease Maternal Grandmother        valve replacement   Heart attack Maternal Grandfather 40   Esophageal cancer Paternal Grandmother    Heart disease Paternal Grandmother    Alcohol abuse Paternal Grandfather    Emphysema Paternal Grandfather    Colon polyps Neg Hx    Colon cancer Neg Hx    Stomach cancer Neg Hx    Rectal cancer Neg Hx    He indicated that his mother is alive. He indicated that his father is deceased. He indicated that his sister is alive. He indicated that his brother is alive. He indicated that his maternal grandmother is deceased. He indicated that his maternal grandfather is deceased. He indicated that his paternal grandmother is deceased. He indicated that his paternal grandfather is deceased. He indicated that his paternal aunt is alive. He indicated that the status of his neg hx is unknown.  Social History     Social History   Socioeconomic History   Marital status: Married    Spouse name: Not on file   Number of children: Not on file   Years of education: Not on file   Highest education level: Not on file  Occupational History   Not on file  Tobacco Use   Smoking status: Former    Current packs/day: 0.00    Average packs/day: 1 pack/day for 21.0 years (21.0 ttl pk-yrs)    Types: Cigarettes    Start date: 33    Quit date: 06/27/2017    Years since quitting: 6.9    Passive exposure: Never   Smokeless tobacco: Never  Vaping Use   Vaping status: Never Used  Substance and Sexual Activity   Alcohol use: Not Currently  Drug use: Not Currently   Sexual activity: Yes    Birth control/protection: Surgical  Other Topics Concern   Not on file  Social History Narrative   Not on file   Social Drivers of Health   Financial Resource Strain: Not on file  Food Insecurity: Not on file  Transportation Needs: Not on file  Physical Activity: Not on file  Stress: Not on file  Social Connections: Not on file  Intimate Partner Violence: Not on file     Review of Systems    General:  No chills, fever, night sweats or weight changes.  Cardiovascular:  No chest pain, dyspnea on exertion, edema, orthopnea, palpitations, paroxysmal nocturnal dyspnea. Dermatological: No rash, lesions/masses Respiratory: No cough, dyspnea Urologic: No hematuria, dysuria Abdominal:   No nausea, vomiting, diarrhea, bright red blood per rectum, melena, or hematemesis Neurologic:  No visual changes, wkns, changes in mental status. All other systems reviewed and are otherwise negative except as noted above.  Physical Exam    VS:  BP 123/70   Pulse 67   Ht 5' 11 (1.803 m)   Wt 205 lb 9.6 oz (93.3 kg)   SpO2 97%   BMI 28.68 kg/m  , BMI Body mass index is 28.68 kg/m. GEN: Well nourished, well developed, in no acute distress. HEENT: normal. Neck: Supple, no JVD, carotid bruits, or masses. Cardiac: RRR, no  murmurs, rubs, or gallops. No clubbing, cyanosis, edema.  Radials/DP/PT 2+ and equal bilaterally.  Respiratory:  Respirations regular and unlabored, clear to auscultation bilaterally. GI: Soft, nontender, nondistended, BS + x 4. MS: no deformity or atrophy. Skin: warm and dry, no rash. Neuro:  Strength and sensation are intact. Psych: Normal affect.  Accessory Clinical Findings    Recent Labs: 11/09/2023: ALT 22; BUN 12; Creat 1.02; Hemoglobin 15.8; Platelets 152.0; Potassium 4.6; Sodium 141; TSH 1.14   Recent Lipid Panel    Component Value Date/Time   CHOL 183 11/09/2023 1554   TRIG 202.0 (H) 11/09/2023 1554   HDL 41.70 11/09/2023 1554   CHOLHDL 4 11/09/2023 1554   VLDL 40.4 (H) 11/09/2023 1554   LDLCALC 101 (H) 11/09/2023 1554   LDLCALC 93 11/17/2021 1520         ECG personally reviewed by me today- EKG Interpretation Date/Time:  Monday May 20 2024 08:16:20 EST Ventricular Rate:  67 PR Interval:  146 QRS Duration:  70 QT Interval:  366 QTC Calculation: 386 R Axis:   72  Text Interpretation: Normal sinus rhythm Normal ECG No previous ECGs available Confirmed by Emelia Hazy [phone]) on 05/20/2024 8:29:47 AM    Echocardiogram 12/21/2023    IMPRESSIONS     1. Left ventricular ejection fraction, by estimation, is 60 to 65%. The  left ventricle has normal function. The left ventricle has no regional  wall motion abnormalities. Left ventricular diastolic parameters were  normal.   2. Right ventricular systolic function is normal. The right ventricular  size is normal.   3. A small pericardial effusion is present.   4. The mitral valve is normal in structure. No evidence of mitral valve  regurgitation. No evidence of mitral stenosis.   5. The aortic valve is normal in structure. Aortic valve regurgitation is  not visualized. No aortic stenosis is present.   6. The inferior vena cava is normal in size with greater than 50%  respiratory variability, suggesting  right atrial pressure of 3 mmHg.   FINDINGS   Left Ventricle: Left ventricular ejection fraction, by estimation, is  60  to 65%. The left ventricle has normal function. The left ventricle has no  regional wall motion abnormalities. The left ventricular internal cavity  size was normal in size. There is   no left ventricular hypertrophy. Left ventricular diastolic parameters  were normal.   Right Ventricle: The right ventricular size is normal. No increase in  right ventricular wall thickness. Right ventricular systolic function is  normal.   Left Atrium: Left atrial size was normal in size.   Right Atrium: Right atrial size was normal in size.   Pericardium: A small pericardial effusion is present.   Mitral Valve: The mitral valve is normal in structure. No evidence of  mitral valve regurgitation. No evidence of mitral valve stenosis.   Tricuspid Valve: The tricuspid valve is normal in structure. Tricuspid  valve regurgitation is not demonstrated. No evidence of tricuspid  stenosis.   Aortic Valve: The aortic valve is normal in structure. Aortic valve  regurgitation is not visualized. No aortic stenosis is present.   Pulmonic Valve: The pulmonic valve was normal in structure. Pulmonic valve  regurgitation is trivial. No evidence of pulmonic stenosis.   Aorta: The aortic root is normal in size and structure.   Venous: The inferior vena cava is normal in size with greater than 50%  respiratory variability, suggesting right atrial pressure of 3 mmHg.   IAS/Shunts: No atrial level shunt detected by color flow Doppler.   Additional Comments: 3D was performed not requiring image post processing  on an independent workstation and was normal.    Assessment & Plan   1.  Chest pain-EKG today shows sinus rhythm 67 bpm.  Describes this chest pain as a pressure type sensation.  Notes symptoms are worse with decline sitting.  Thyroid  panel 11/09/2023 reassuring.  CBC unremarkable, CMP  unremarkable Order echocardiogram Order magnesium, cbc, mag Complete echo  Palpitations-Notes palpitations around 1 time per month.  He describes the episodes as irregular type beat.   Order  magnesium Due to infrequent nature of palpitations will defer monitoring at this time.  Hyperlipidemia-LDL 101 on 11/09/2023. Increase physical activity as tolerated High-fiber diet Follows with PCP  Disposition: Follow-up with Dr. Floretta or me in 1-2 months.   Josefa HERO. Keiva Dina NP-C     05/20/2024, 8:37 AM [facility] [address] [place], [place] [address] Office [phone]    Notice: This dictation was prepared with Dragon dictation along with smaller phrase technology. Any transcriptional errors that result from this process are unintentional and may not be corrected upon review.   I spent 14 minutes examining this patient, reviewing medications, and using patient centered shared decision making involving their cardiac care.   I spent  20 minutes reviewing past medical history,  medications, and prior cardiac tests.

## 2024-05-20 ENCOUNTER — Encounter: Payer: Self-pay | Admitting: General Practice

## 2024-05-20 ENCOUNTER — Ambulatory Visit: Attending: General Practice | Admitting: General Practice

## 2024-05-20 VITALS — BP 123/70 | HR 67 | Ht 71.0 in | Wt 205.6 lb

## 2024-05-20 DIAGNOSIS — R002 Palpitations: Secondary | ICD-10-CM | POA: Diagnosis not present

## 2024-05-20 DIAGNOSIS — R072 Precordial pain: Secondary | ICD-10-CM

## 2024-05-20 DIAGNOSIS — E782 Mixed hyperlipidemia: Secondary | ICD-10-CM | POA: Diagnosis not present

## 2024-05-20 LAB — BASIC METABOLIC PANEL WITH GFR
BUN/Creatinine Ratio: 18 (ref 9–20)
BUN: 17 mg/dL (ref 6–24)
CO2: 23 mmol/L (ref 20–29)
Calcium: 9 mg/dL (ref 8.7–10.2)
Chloride: 105 mmol/L (ref 96–106)
Creatinine, Ser: 0.97 mg/dL (ref 0.76–1.27)
Glucose: 88 mg/dL (ref 70–99)
Potassium: 4.6 mmol/L (ref 3.5–5.2)
Sodium: 141 mmol/L (ref 134–144)
eGFR: 96 mL/min/1.73 (ref >59)

## 2024-05-20 LAB — CBC
Hematocrit: 45 % (ref 37.5–51.0)
Hemoglobin: 15 g/dL (ref 13.0–17.7)
MCH: 31.9 pg (ref 26.6–33.0)
MCHC: 33.3 g/dL (ref 31.5–35.7)
MCV: 96 fL (ref 79–97)
Platelets: 175 x10E3/uL (ref 150–450)
RBC: 4.7 x10E6/uL (ref 4.14–5.80)
RDW: 12.1 % (ref 11.6–15.4)
WBC: 6.5 x10E3/uL (ref 3.4–10.8)

## 2024-05-20 LAB — MAGNESIUM: Magnesium: 2.3 mg/dL (ref 1.6–2.3)

## 2024-05-20 NOTE — Patient Instructions (Signed)
 Medication Instructions:  Your physician recommends that you continue on your current medications as directed. Please refer to the Current Medication list given to you today.  *If you need a refill on your cardiac medications before your next appointment, please call your pharmacy*  Lab Work: TODAY:  BMET, CBC, & MAG  If you have labs (blood work) drawn today and your tests are completely normal, you will receive your results only by: MyChart Message (if you have MyChart) OR A paper copy in the mail If you have any lab test that is abnormal or we need to change your treatment, we will call you to review the results.  Testing/Procedures: Your physician has requested that you have an echocardiogram. Echocardiography is a painless test that uses sound waves to create images of your heart. It provides your doctor with information about the size and shape of your heart and how well your heart's chambers and valves are working. This procedure takes approximately one hour. There are no restrictions for this procedure. Please do NOT wear cologne, perfume, aftershave, or lotions (deodorant is allowed). Please arrive 15 minutes prior to your appointment time.  Please note: We ask at that you not bring children with you during ultrasound (echo/ vascular) testing. Due to room size and safety concerns, children are not allowed in the ultrasound rooms during exams. Our front office staff cannot provide observation of children in our lobby area while testing is being conducted. An adult accompanying a patient to their appointment will only be allowed in the ultrasound room at the discretion of the ultrasound technician under special circumstances. We apologize for any inconvenience.   Follow-Up: At [facility], you and your health needs are our priority.  As part of our continuing mission to provide you with exceptional heart care, our providers are all part of one team.  This team includes your  primary Cardiologist (physician) and Advanced Practice Providers or APPs (Physician Assistants and Nurse Practitioners) who all work together to provide you with the care you need, when you need it.  Your next appointment:   AFTER ECHO   Provider:   Josefa Beauvais, NP          We recommend signing up for the patient portal called MyChart.  Sign up information is provided on this After Visit Summary.  MyChart is used to connect with patients for Virtual Visits (Telemedicine).  Patients are able to view lab/test results, encounter notes, upcoming appointments, etc.  Non-urgent messages can be sent to your provider as well.   To learn more about what you can do with MyChart, go to [email or web address].   Other Instructions

## 2024-05-21 ENCOUNTER — Ambulatory Visit: Payer: Self-pay | Admitting: General Practice

## 2024-05-21 NOTE — Progress Notes (Signed)
The patient has been notified of the result and verbalized understanding.  All questions (if any) were answered.     

## 2024-06-11 ENCOUNTER — Ambulatory Visit: Admitting: Allergy & Immunology

## 2024-06-11 ENCOUNTER — Encounter: Payer: Self-pay | Admitting: Allergy & Immunology

## 2024-06-11 ENCOUNTER — Other Ambulatory Visit: Payer: Self-pay

## 2024-06-11 VITALS — BP 130/78 | HR 57 | Temp 98.1°F | Resp 18 | Ht 71.0 in | Wt 203.1 lb

## 2024-06-11 DIAGNOSIS — T7819XD Other adverse food reactions, not elsewhere classified, subsequent encounter: Secondary | ICD-10-CM

## 2024-06-11 DIAGNOSIS — T782XXD Anaphylactic shock, unspecified, subsequent encounter: Secondary | ICD-10-CM

## 2024-06-11 DIAGNOSIS — J4520 Mild intermittent asthma, uncomplicated: Secondary | ICD-10-CM | POA: Diagnosis not present

## 2024-06-11 DIAGNOSIS — R5383 Other fatigue: Secondary | ICD-10-CM

## 2024-06-11 DIAGNOSIS — J3089 Other allergic rhinitis: Secondary | ICD-10-CM | POA: Diagnosis not present

## 2024-06-11 DIAGNOSIS — J302 Other seasonal allergic rhinitis: Secondary | ICD-10-CM | POA: Diagnosis not present

## 2024-06-11 DIAGNOSIS — R1084 Generalized abdominal pain: Secondary | ICD-10-CM | POA: Diagnosis not present

## 2024-06-11 MED ORDER — EPINEPHRINE 0.3 MG/0.3ML IJ SOAJ: 0.3000 mg | INTRAMUSCULAR | 1 refills | Status: AC | PRN

## 2024-06-11 MED ORDER — ALBUTEROL SULFATE HFA 108 (90 BASE) MCG/ACT IN AERS: 2.0000 | INHALATION_SPRAY | RESPIRATORY_TRACT | 1 refills | Status: AC | PRN

## 2024-06-11 NOTE — Patient Instructions (Addendum)
 1. Anaphylactic shock due to food - We will repeat labs to beans as well as nuts, wheat, egg, coconut, corn, and sesame.  - Consider Xolair to help with decreasing the incicdence of cross contamination (and it might let you expand your diet a bit) - But some of your reactions are not IgE mediated.   2. Mild intermittent asthma, uncomplicated - Lung testing looks awesome. - Continue with albuterol  as needed.  3. Environmental allergies - Continue with Zyrtec since this cross into the brain the least amount. - Continue with the nose spray.   4. Fatigue - Continue to monitor sleep quality.  - Trazodone  seems to have helped.   5. Return in about 1 year (around 06/11/2025). You can have the follow up appointment with Dr. Iva or a Nurse Practicioner (our Nurse Practitioners are excellent and always have Physician oversight!).    Please inform us  of any Emergency Department visits, hospitalizations, or changes in symptoms. Call us  before going to the ED for breathing or allergy symptoms since we might be able to fit you in for a sick visit. Feel free to contact us  anytime with any questions, problems, or concerns.  It was a pleasure to see you again today!  Websites that have reliable patient information: 1. American Academy of Asthma, Allergy, and Immunology: www.aaaai.org 2. Food Allergy Research and Education (FARE): foodallergy.org 3. Mothers of Asthmatics: http://www.asthmacommunitynetwork.org 4. American College of Allergy, Asthma, and Immunology: www.acaai.org      Like us  on [employer] and Instagram for our latest updates!      A healthy democracy works best when [employer] participate! Make sure you are registered to vote! If you have moved or changed any of your contact information, you will need to get this updated before voting! Scan the QR codes below to learn more!              Component     Latest Ref Rng 11/26/2020  IgE (Immunoglobulin E), Serum      6 - 495 IU/mL 37   D Pteronyssinus IgE     Class 0 kU/L <0.10   D Farinae IgE     Class 0 kU/L <0.10   Cat Dander IgE     Class 0 kU/L <0.10   Dog Dander IgE     Class 0 kU/L <0.10   Bermuda Grass IgE     Class I kU/L 0.43 !   Timothy Grass IgE     Class IV kU/L 6.41 !   Johnson Grass IgE     Class I kU/L 0.44 !   Cockroach, German IgE     Class 0 kU/L <0.10   Penicillium Chrysogen IgE     Class 0 kU/L <0.10   Cladosporium Herbarum IgE     Class 0 kU/L <0.10   Aspergillus Fumigatus IgE     Class 0 kU/L <0.10   Alternaria Alternata IgE     Class 0 kU/L <0.10   Maple/Box Elder IgE     Class 0 kU/L <0.10   Common Silver Valrie IgE     Class 0 kU/L <0.10   Cedar, [place] IgE     Class 0/I kU/L 0.12 !   Oak, White IgE     Class 0 kU/L <0.10   Elm, American IgE     Class 0 kU/L <0.10   Cottonwood IgE     Class 0 kU/L <0.10   Pecan, Hickory IgE     Class 0 kU/L <  0.10   White Mulberry IgE     Class 0 kU/L <0.10   Ragweed, Short IgE     Class III kU/L 2.40 !   Pigweed, Rough IgE     Class 0 kU/L <0.10   Sheep Sorrel IgE Qn     Class 0 kU/L <0.10   Mouse Urine IgE     Class 0 kU/L <0.10   Class Description Allergens Comment   F017-IgE Hazelnut (Filbert)     Class 0 kU/L <0.10   F256-IgE Walnut     Class 0 kU/L <0.10   F202-IgE Cashew Nut     Class 0 kU/L <0.10   F018-IgE Brazil Nut     Class 0 kU/L <0.10   Peanut, IgE     Class 0 kU/L <0.10   Macadamia Nut, IgE     Class 0 kU/L <0.10   Pecan Nut IgE     Class 0 kU/L <0.10   F203-IgE Pistachio Nut     Class 0 kU/L <0.10   F020-IgE Almond     Class 0 kU/L <0.10   F232-IgE Ovalbumin     Class 0 kU/L <0.10   F233-IgE Ovomucoid     Class 0 kU/L <0.10   Black Bean*, IgE     <0.35 kU/L <0.35   Class Interpretation 0   Soybean IgE     Class 0 kU/L <0.10   Allergen Corn, IgE     Class 0 kU/L <0.10   Chicken IgE     Class 0 kU/L <0.10   Pumpkin IgE     Class 0 kU/L <0.10   Sunflower Seed k84     Class 0  kU/L <0.10   Sesame Seed IgE     Class 0 kU/L <0.10   F309-IgE Chick Pea     Class 0 kU/L <0.10   Kidney Bean IgE     Class 0 kU/L <0.10   Wheat IgE     Class 0/I kU/L 0.24 !   Allergen Green Pea IgE     Class 0 kU/L <0.10   Allergen Salmon IgE     Class 0 kU/L <0.10   Tryptase     2.2 - 13.2 ug/L 4.6   Allergen Oat IgE     Class 0 kU/L <0.10   Allergen Turkey IgE     Class 0 kU/L <0.10   Allergen Coconut IgE     Class 0 kU/L <0.10

## 2024-06-11 NOTE — Progress Notes (Signed)
 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 Lonni Ester  Plan/Recommendations:   1. Anaphylactic shock due to food - We will repeat labs to beans as well as nuts, wheat, egg, coconut, corn, and sesame.  - Consider Xolair to help with decreasing the incicdence of cross contamination (and it might let you expand your diet a bit) - But some of your reactions are not IgE mediated.   2. Mild intermittent asthma, uncomplicated - Lung testing looks awesome. - Continue with albuterol  as needed.  3. Environmental allergies - Continue with Zyrtec since this cross into the brain the least amount. - Continue with the nose spray.   4. Fatigue - Continue to monitor sleep quality.  - Trazodone  seems to have helped.   5. Return in about 1 year (around 06/11/2025). You can have the follow up appointment with Dr. Iva or a Nurse Practicioner (our Nurse Practitioners are excellent and always have Physician oversight!).    Subjective:   Jerimy Johanson is a 48 y.o. male presenting today for follow up of  Chief Complaint  Patient presents with   Follow-up   blood work    Lendell Gallick has a history of the following: Patient Active Problem List   Diagnosis Date Noted   Vitamin D  deficiency 11/14/2023   Palpitations 11/09/2023   Nicotine dependence 08/28/2022   Chronic fatigue 08/28/2022   Family history of prostate cancer 08/28/2022   Hyperlipidemia 11/17/2021   Encounter for medication monitoring 11/17/2021   Screening PSA (prostate specific antigen) 11/17/2021   Seasonal allergies 11/17/2021   Pain in left acromioclavicular joint 09/14/2021   Pain in right hip 03/12/2021   Piriformis syndrome of both sides 02/10/2021   Left shoulder pain 12/24/2020    Dyshidrotic eczema 11/28/2020   Chronic rhinitis 11/28/2020   Mild intermittent asthma, uncomplicated 11/28/2020   Anaphylactic shock due to adverse food reaction 11/28/2020   Degenerative cervical disc 03/25/2020   Nonallopathic lesion of cervical region 03/25/2020   Nonallopathic lesion of thoracic region 03/25/2020   Nonallopathic lesion of rib cage 03/25/2020   Seronegative rheumatoid arthritis (HCC) 03/25/2020   Adjustment insomnia 03/20/2018   Recurrent major depressive disorder, in full remission 03/20/2018   Depressive disorder 07/24/2017   Hyperinsulinism 07/24/2017   Nausea 07/24/2017    History obtained from: chart review and patient.  Discussed the use of AI scribe software for clinical note transcription with the patient and/or guardian, who gave verbal consent to proceed.  Tysin is a 48 y.o. male presenting for a follow up visit.  He was last seen in May 2023.  At that time, he continued to avoid weight.  Asthma was under good control with albuterol  as needed.  We did change him from Allegra to Zyrtec to cause less sedation.  We gave him Ryaltris  to try.  Since last visit, he has done well.  Asthma/Respiratory Symptom History: Gaius's asthma has been well controlled. He has not required rescue medication, experienced nocturnal awakenings due to lower respiratory symptoms, nor have activities of daily living been limited. He has required no Emergency Department or Urgent Care visits for his asthma. He has required zero courses of systemic steroids for asthma exacerbations since the last visit. ACT score today is 24, indicating excellent asthma symptom control.  His asthma has been stable, and he has not needed to use his albuterol  inhaler recently.   Allergic Rhinitis Symptom History: He takes Zyrtec daily for environmental allergies and has not attempted to discontinue it. He has lived in [place]  for seven years and has three dogs and two cats.   Food Allergy  Symptom History: He has not had any eliminating. He does read labels. He does not eat out a lot. He is very cautious about what is in foods when he eats out. He has never had threat swelling with this, but he has had hives intermittently, although not consistently. Hands are a problem with the hives. He has a history of food allergies and is currently avoiding peanuts, wheat, eggs, corn, soy, and various beans. Recently, he reintroduced eggs into his diet without adverse effects but remains uncertain about other foods. He experiences gastrointestinal upset and occasional heartburn, which he suspects may be related to his diet. He has not conducted any elimination diet recently to identify specific triggers. He is cautious when eating out and always checks food labels. He carries an EpiPen  but has not experienced throat swelling, primarily dealing with gastrointestinal upset and hives, particularly on his hands.  He is considering rechecking his allergies to nuts, corn, eggs, various beans, coconut, oat, and tomatoes, as he experiences symptoms like heartburn and GI upset with certain foods. He avoids coconut due to heartburn symptoms and has reduced oat consumption. He has not had issues with salmon, sesame, sunflower, or pumpkin, which were previously tested.  Skin Symptom History: He has been itching a lot more than normal. He experiences increased itchiness and pain.   GERD Symptom History: He has bene having a lot of GI upset. He thinks that he had some heartburn yesterday that were triggered by almonds.   He monitors his sleep using an Apple Watch and reports improved sleep quality and reduced fatigue, which he attributes to adjusting his sleep schedule and medication use, including trazodone .   Otherwise, there have been no changes to his past medical history, surgical history, family history, or social history.    Review of systems otherwise negative other than that mentioned in the  HPI.    Objective:   There were no vitals taken for this visit. There is no height or weight on file to calculate BMI.    Physical Exam Vitals reviewed.  Constitutional:      Appearance: He is well-developed.     Comments: Very pleasant male. Cooperative with the exam.  Smiling.  HENT:     Head: Normocephalic and atraumatic.     Right Ear: Tympanic membrane, ear canal and external ear normal. No drainage, swelling or tenderness. Tympanic membrane is not injected, scarred, erythematous, retracted or bulging.     Left Ear: Tympanic membrane, ear canal and external ear normal. No drainage, swelling or tenderness. Tympanic membrane is not injected, scarred, erythematous, retracted or bulging.     Nose: No nasal deformity, septal deviation, mucosal edema or rhinorrhea.     Right Turbinates: Enlarged and swollen.     Left Turbinates: Enlarged and swollen.     Right Sinus: No maxillary sinus tenderness or frontal sinus tenderness.     Left Sinus: No maxillary sinus tenderness or frontal sinus tenderness.     Mouth/Throat:     Mouth: Mucous membranes are not pale and not dry.     Pharynx: Uvula midline.  Eyes:     General:  Right eye: No discharge.        Left eye: No discharge.     Conjunctiva/sclera: Conjunctivae normal.     Right eye: Right conjunctiva is not injected. No chemosis.    Left eye: Left conjunctiva is not injected. No chemosis.    Pupils: Pupils are equal, round, and reactive to light.  Cardiovascular:     Rate and Rhythm: Normal rate and regular rhythm.     Heart sounds: Normal heart sounds.  Pulmonary:     Effort: Pulmonary effort is normal. No tachypnea, accessory muscle usage or respiratory distress.     Breath sounds: Normal breath sounds. No wheezing, rhonchi or rales.     Comments: Moving air well in all lung fields. Chest:     Chest wall: No tenderness.  Abdominal:     Tenderness: There is no abdominal tenderness. There is no guarding or rebound.   Lymphadenopathy:     Head:     Right side of head: No submandibular, tonsillar or occipital adenopathy.     Left side of head: No submandibular, tonsillar or occipital adenopathy.     Cervical: No cervical adenopathy.  Skin:    General: Skin is warm.     Capillary Refill: Capillary refill takes less than 2 seconds.     Coloration: Skin is not pale.     Findings: No abrasion, erythema, petechiae or rash. Rash is not papular, urticarial or vesicular.     Comments: No eczematous or urticarial lesions.  Neurological:     Mental Status: He is alert.  Psychiatric:        Behavior: Behavior is cooperative.      Diagnostic studies: labs sent instead  Spirometry: results normal (FEV1: 4.70/115%, FVC: 5.74/111%, FEV1/FVC: 82%).    Spirometry consistent with normal pattern.   Allergy Studies: none        Marty Shaggy, MD  [facility] 

## 2024-06-14 LAB — ALLERGEN, TOMATO F25: Allergen Tomato, IgE: <0.1 kU/L

## 2024-06-15 LAB — ALLERGEN EGG YOLK F75: IgE Egg (Yolk): <0.1 kU/L

## 2024-06-15 LAB — IGE NUT PROF. W/COMPONENT RFLX
F017-IgE Hazelnut (Filbert): <0.1 kU/L
F018-IgE Brazil Nut: <0.1 kU/L
F020-IgE Almond: <0.1 kU/L
F202-IgE Cashew Nut: <0.1 kU/L
F203-IgE Pistachio Nut: <0.1 kU/L
F256-IgE Walnut: <0.1 kU/L
Macadamia Nut, IgE: <0.1 kU/L
Peanut, IgE: <0.1 kU/L
Pecan Nut IgE: <0.1 kU/L

## 2024-06-15 LAB — ALLERGEN, GREEN BEAN, RF315: Allergen Green Bean IgE: <0.1 kU/L

## 2024-06-15 LAB — EGG COMPONENT PANEL
F232-IgE Ovalbumin: <0.1 kU/L
F233-IgE Ovomucoid: <0.1 kU/L

## 2024-06-15 LAB — ALLERGEN, KIDNEY BEAN, RF287: Kidney Bean IgE: <0.1 kU/L

## 2024-06-15 LAB — ALLERGEN COCONUT IGE: Allergen Coconut IgE: <0.1 kU/L

## 2024-06-15 LAB — ALLERGEN, WHEAT, F4: Wheat IgE: 0.26 kU/L — AB

## 2024-06-15 LAB — IGE: IgE (Immunoglobulin E), Serum: 58 [IU]/mL (ref 6–495)

## 2024-06-15 LAB — ALLERGEN, CHICK PEA, RF309, IGE: F309-IgE Chick Pea: <0.1 kU/L

## 2024-06-15 LAB — ALLERGEN PEA F12: Allergen Green Pea IgE: <0.1 kU/L

## 2024-06-15 LAB — ALLERGEN, BEAN BLACK
Black Bean*, IgE: <0.35 kU/L
Class Interpretation: 0

## 2024-06-15 LAB — ALLERGEN,OAT,F7: Allergen Oat IgE: <0.1 kU/L

## 2024-06-15 LAB — TRYPTASE: Tryptase: 4.6 ug/L (ref 2.2–13.2)

## 2024-06-15 LAB — ALLERGEN SESAME F10: Sesame Seed IgE: <0.1 kU/L

## 2024-06-15 LAB — ALLERGEN, CORN F8: Allergen Corn, IgE: <0.1 kU/L

## 2024-06-17 ENCOUNTER — Ambulatory Visit (HOSPITAL_COMMUNITY)

## 2024-06-18 ENCOUNTER — Ambulatory Visit: Payer: Self-pay | Admitting: Allergy & Immunology

## 2024-07-02 ENCOUNTER — Telehealth: Payer: Self-pay

## 2024-07-02 ENCOUNTER — Other Ambulatory Visit: Payer: Self-pay | Admitting: *Deleted

## 2024-07-02 ENCOUNTER — Other Ambulatory Visit (HOSPITAL_COMMUNITY): Payer: Self-pay

## 2024-07-02 DIAGNOSIS — Z91018 Allergy to other foods: Secondary | ICD-10-CM

## 2024-07-02 NOTE — Telephone Encounter (Signed)
*  [facility]  Pharmacy Patient Advocate Encounter   Received notification from [facility] that prior authorization for Xolair 300mg  q 4 weeks is required/requested.   Insurance verification completed.   The patient is insured through [facility].   Per test claim: PA required; PA submitted to above mentioned insurance via Latent Key/confirmation #/EOC [id number] Status is pending

## 2024-07-02 NOTE — Telephone Encounter (Signed)
 Started PA in other encounter- 07/02/2024

## 2024-07-03 ENCOUNTER — Ambulatory Visit (HOSPITAL_COMMUNITY)

## 2024-07-04 ENCOUNTER — Ambulatory Visit (HOSPITAL_COMMUNITY)
Admission: RE | Admit: 2024-07-04 | Discharge: 2024-07-04 | Disposition: A | Discharge status: Home / Self Care | Source: Ambulatory Visit | Attending: General Practice | Admitting: General Practice

## 2024-07-04 DIAGNOSIS — E782 Mixed hyperlipidemia: Secondary | ICD-10-CM | POA: Diagnosis present

## 2024-07-04 DIAGNOSIS — R002 Palpitations: Secondary | ICD-10-CM | POA: Diagnosis present

## 2024-07-04 DIAGNOSIS — R079 Chest pain, unspecified: Secondary | ICD-10-CM

## 2024-07-04 DIAGNOSIS — R072 Precordial pain: Secondary | ICD-10-CM | POA: Insufficient documentation

## 2024-07-04 LAB — ECHOCARDIOGRAM COMPLETE
Area-P 1/2: 3.24 cm2
S' Lateral: 3.3 cm

## 2024-07-05 ENCOUNTER — Encounter: Payer: Self-pay | Admitting: Family Medicine

## 2024-07-05 ENCOUNTER — Ambulatory Visit: Admitting: Family Medicine

## 2024-07-05 VITALS — BP 116/80 | HR 59 | Temp 97.5°F | Ht 71.0 in | Wt 204.6 lb

## 2024-07-05 DIAGNOSIS — F17298 Nicotine dependence, other tobacco product, with other nicotine-induced disorders: Secondary | ICD-10-CM | POA: Diagnosis not present

## 2024-07-05 DIAGNOSIS — F5101 Primary insomnia: Secondary | ICD-10-CM

## 2024-07-05 DIAGNOSIS — F418 Other specified anxiety disorders: Secondary | ICD-10-CM

## 2024-07-05 DIAGNOSIS — R079 Chest pain, unspecified: Secondary | ICD-10-CM

## 2024-07-05 MED ORDER — VARENICLINE TARTRATE (STARTER) 0.5 MG X 11 & 1 MG X 42 PO TBPK: ORAL_TABLET | ORAL | 0 refills | Status: AC

## 2024-07-05 NOTE — Progress Notes (Signed)
 " Assessment & Plan   Assessment/Plan:    Problem List Items Addressed This Visit   None       Assessment and Plan Assessment & Plan       There are no discontinued medications.  No follow-ups on file.        Subjective:   Encounter date: 07/05/2024  Darrell Jordan is a 49 y.o. male who has Degenerative cervical disc; Nonallopathic lesion of cervical region; Nonallopathic lesion of thoracic region; Nonallopathic lesion of rib cage; Seronegative rheumatoid arthritis (HCC); Dyshidrotic eczema; Chronic rhinitis; Mild intermittent asthma, uncomplicated; Anaphylactic shock due to adverse food reaction; Adjustment insomnia; Depressive disorder; Hyperinsulinism; Recurrent major depressive disorder, in full remission; Nausea; Left shoulder pain; Piriformis syndrome of both sides; Pain in right hip; Pain in left acromioclavicular joint; Hyperlipidemia; Encounter for medication monitoring; Screening PSA (prostate specific antigen); Seasonal allergies; Nicotine dependence; Chronic fatigue; Family history of prostate cancer; Palpitations; and Vitamin D  deficiency on their problem list..   He  has a past medical history of Allergy (1980), Depression (1990), Mild intermittent asthma, uncomplicated (11/28/2020), and Rheumatoid arthritis (HCC).SABRA   He presents with chief complaint of Follow-up (Patient states echo cardiogram results have been good but has feeling of pressure in chest. States he wants to deal with it. Wants to discuss chantix . ) .   Discussed the use of AI scribe software for clinical note transcription with the patient, who gave verbal consent to proceed.  History of Present Illness      ROS  Past Surgical History:  Procedure Laterality Date   VASECTOMY  2010    Outpatient Medications Prior to Visit  Medication Sig Dispense Refill   albuterol  (VENTOLIN  HFA) 108 (90 Base) MCG/ACT inhaler Inhale 1-2 puffs into the lungs every 6 (six) hours as needed for wheezing or  shortness of breath. 18 g 0   albuterol  (VENTOLIN  HFA) 108 (90 Base) MCG/ACT inhaler Inhale 2 puffs into the lungs every 4 (four) hours as needed for wheezing or shortness of breath. 18 g 1   azelastine  (ASTELIN ) 0.1 % nasal spray Place 2 sprays into both nostrils 2 (two) times daily. 30 mL 12   Cetirizine HCl (ZYRTEC ALLERGY PO) Take by mouth.     Cholecalciferol (VITAMIN D3 PO) Take by mouth daily.     EPINEPHrine  (EPIPEN  2-PAK) 0.3 mg/0.3 mL IJ SOAJ injection Inject 0.3 mg into the muscle as needed for anaphylaxis. 0.3 mL 1   EPINEPHrine  0.3 mg/0.3 mL IJ SOAJ injection Inject 0.3 mg into the muscle as needed for anaphylaxis. 2 each 1   fluticasone  (FLONASE ) 50 MCG/ACT nasal spray Place 2 sprays into both nostrils daily. 16 g 6   QUERCETIN PO Take by mouth daily.     tiZANidine  (ZANAFLEX ) 2 MG tablet Take 1 tablet (2 mg total) by mouth at bedtime. 30 tablet 0   traZODone  (DESYREL ) 50 MG tablet TAKE 1 TABLET BY MOUTH EVERYDAY AT BEDTIME 90 tablet 1   Vitamin D , Ergocalciferol , (DRISDOL ) 1.25 MG (50000 UNIT) CAPS capsule Take 1 capsule (50,000 Units total) by mouth every 7 (seven) days. 12 capsule 3   No facility-administered medications prior to visit.    Family History  Problem Relation Age of Onset   Hypotension Mother    Atrial fibrillation Mother    Other Father        joint pain, undiagnosed   Prostate cancer Father 60   Bone cancer Father        mets from prostate cancer  Arthritis Father    Cancer Father    Rheum arthritis Brother 74   Lupus Paternal Aunt    Heart disease Maternal Grandmother        valve replacement   Heart attack Maternal Grandfather 40   Esophageal cancer Paternal Grandmother    Heart disease Paternal Grandmother    Alcohol abuse Paternal Grandfather    Emphysema Paternal Grandfather    Colon polyps Neg Hx    Colon cancer Neg Hx    Stomach cancer Neg Hx    Rectal cancer Neg Hx     Social History   Socioeconomic History   Marital status:  Married    Spouse name: Not on file   Number of children: Not on file   Years of education: Not on file   Highest education level: Not on file  Occupational History   Not on file  Tobacco Use   Smoking status: Former    Current packs/day: 0.00    Average packs/day: 1 pack/day for 21.0 years (21.0 ttl pk-yrs)    Types: Cigarettes    Start date: 66    Quit date: 06/27/2017    Years since quitting: 7.0    Passive exposure: Never   Smokeless tobacco: Never  Vaping Use   Vaping status: Never Used  Substance and Sexual Activity   Alcohol use: Not Currently   Drug use: Not Currently   Sexual activity: Yes    Birth control/protection: Surgical  Other Topics Concern   Not on file  Social History Narrative   Not on file   Social Drivers of Health   Tobacco Use: Medium Risk (07/05/2024)   Patient History    Smoking Tobacco Use: Former    Smokeless Tobacco Use: Never    Passive Exposure: Never  Programmer, Applications: Not on file  Food Insecurity: Not on file  Transportation Needs: Not on file  Physical Activity: Not on file  Stress: Not on file  Social Connections: Not on file  Intimate Partner Violence: Not on file  Depression (PHQ2-9): Low Risk (07/05/2024)   Depression (PHQ2-9)    PHQ-2 Score: 0  Alcohol Screen: Not on file  Housing: Not on file  Utilities: Not on file  Health Literacy: Not on file                                                                                                  Objective:  Physical Exam: BP 116/80   Pulse (!) 59   Temp (!) 97.5 F (36.4 C) (Oral)   Ht 5' 11 (1.803 m)   Wt 204 lb 9.6 oz (92.8 kg)   SpO2 98%   BMI 28.54 kg/m    Physical Exam    Physical Exam  ECHOCARDIOGRAM COMPLETE Result Date: 07/04/2024    ECHOCARDIOGRAM REPORT   Patient Name:   Darrell Jordan    Date of Exam: 07/04/2024 Medical Rec #:  [id number]     Height:       71.0 in Accession #:    [id number]    Weight:       203.1  lb Date of Birth:  Dec 14, 1975     BSA:           2.122 m Patient Age:    48 years      BP:           123/70 mmHg Patient Gender: M             HR:           56 bpm. Exam Location:  [address] Procedure: 2D Echo, Cardiac Doppler, Color Doppler, 3D Echo and Strain Analysis            (Both Spectral and Color Flow Doppler were utilized during            procedure). Indications:    R07.9 Chest pain  History:        Patient has prior history of Echocardiogram examinations, most                 recent 11/09/2023. Signs/Symptoms:Chest Pain and Palpitations;                 Risk Factors:Dyslipidemia and Former Smoker.  Sonographer:    Elsie Bohr RDCS Referring Phys: [id number] JESSE M CLEAVER IMPRESSIONS  1. Left ventricular ejection fraction, by estimation, is 55 to 60%. Left ventricular ejection fraction by 3D volume is 60 %. The left ventricle has normal function. The left ventricle has no regional wall motion abnormalities. Left ventricular diastolic  parameters were normal. The average left ventricular global longitudinal strain is -20.4 %. The global longitudinal strain is normal.  2. Right ventricular systolic function is normal. The right ventricular size is normal.  3. The mitral valve is normal in structure. Trivial mitral valve regurgitation. No evidence of mitral stenosis.  4. The aortic valve is normal in structure. Aortic valve regurgitation is not visualized. No aortic stenosis is present.  5. The inferior vena cava is normal in size with greater than 50% respiratory variability, suggesting right atrial pressure of 3 mmHg. FINDINGS  Left Ventricle: Left ventricular ejection fraction, by estimation, is 55 to 60%. Left ventricular ejection fraction by 3D volume is 60 %. The left ventricle has normal function. The left ventricle has no regional wall motion abnormalities. The average left ventricular global longitudinal strain is -20.4 %. Strain was performed and the global longitudinal strain is normal. The left ventricular internal cavity size  was normal in size. There is no left ventricular hypertrophy. Left ventricular diastolic parameters were normal. Right Ventricle: The right ventricular size is normal. No increase in right ventricular wall thickness. Right ventricular systolic function is normal. Left Atrium: Left atrial size was normal in size. Right Atrium: Right atrial size was normal in size. Pericardium: Trivial pericardial effusion is present. The pericardial effusion is posterior to the left ventricle. Mitral Valve: The mitral valve is normal in structure. Trivial mitral valve regurgitation. No evidence of mitral valve stenosis. Tricuspid Valve: The tricuspid valve is normal in structure. Tricuspid valve regurgitation is trivial. No evidence of tricuspid stenosis. Aortic Valve: The aortic valve is normal in structure. Aortic valve regurgitation is not visualized. No aortic stenosis is present. Pulmonic Valve: The pulmonic valve was normal in structure. Pulmonic valve regurgitation is trivial. No evidence of pulmonic stenosis. Aorta: The aortic root is normal in size and structure. Venous: The inferior vena cava is normal in size with greater than 50% respiratory variability, suggesting right atrial pressure of 3 mmHg. IAS/Shunts: No atrial level shunt detected by color flow Doppler. Additional Comments: 3D  was performed not requiring image post processing on an independent workstation and was normal.  LEFT VENTRICLE PLAX 2D LVIDd:         5.10 cm         Diastology LVIDs:         3.30 cm         LV e' medial:    11.60 cm/s LV PW:         1.00 cm         LV E/e' medial:  8.1 LV IVS:        0.90 cm         LV e' lateral:   14.80 cm/s LVOT diam:     2.10 cm         LV E/e' lateral: 6.3 LV SV:         83 LV SV Index:   39              2D Longitudinal LVOT Area:     3.46 cm        Strain LV IVRT:       103 msec        2D Strain GLS   -19.2 %                                (A4C):                                2D Strain GLS   -18.3 %                                 (A3C):                                2D Strain GLS   -23.6 %                                (A2C):                                2D Strain GLS   -20.4 %                                Avg:                                 3D Volume EF                                LV 3D EF:    Left                                             ventricul  ar                                             ejection                                             fraction                                             by 3D                                             volume is                                             60 %.                                 3D Volume EF:                                3D EF:        60 %                                LV EDV:       141 ml                                LV ESV:       56 ml                                LV SV:        85 ml RIGHT VENTRICLE             IVC RV S prime:     14.40 cm/s  IVC diam: 1.30 cm TAPSE (M-mode): 2.2 cm                             PULMONARY VEINS                             Diastolic Velocity: 44.80 cm/s                             S/D Velocity:       1.20                             Systolic Velocity:  52.80 cm/s LEFT ATRIUM  Index        RIGHT ATRIUM           Index LA diam:        3.50 cm 1.65 cm/m   RA Pressure: 3.00 mmHg LA Vol (A2C):   37.8 ml 17.81 ml/m  RA Area:     15.20 cm LA Vol (A4C):   49.2 ml 23.18 ml/m  RA Volume:   39.50 ml  18.61 ml/m LA Biplane Vol: 45.0 ml 21.20 ml/m  AORTIC VALVE LVOT Vmax:   119.00 cm/s LVOT Vmean:  78.200 cm/s LVOT VTI:    0.240 m  AORTA Ao Root diam: 3.40 cm Ao Asc diam:  2.70 cm MITRAL VALVE               TRICUSPID VALVE MV Area (PHT): 3.24 cm    Estimated RAP:  3.00 mmHg MV Decel Time: 234 msec MV E velocity: 93.80 cm/s  SHUNTS MV A velocity: 67.10 cm/s  Systemic VTI:  0.24 m MV E/A ratio:  1.40        Systemic Diam: 2.10 cm Toribio Fuel MD Electronically  signed by Toribio Fuel MD Signature Date/Time: 07/04/2024/10:32:08 AM    Final     Recent Results (from the past 2160 hours)  CBC     Status: None   Collection Time: 05/20/24  9:30 AM  Result Value Ref Range   WBC 6.5 3.4 - 10.8 x10E3/uL   RBC 4.70 4.14 - 5.80 x10E6/uL   Hemoglobin 15.0 13.0 - 17.7 g/dL   Hematocrit 54.9 62.4 - 51.0 %   MCV 96 79 - 97 fL   MCH 31.9 26.6 - 33.0 pg   MCHC 33.3 31.5 - 35.7 g/dL   RDW 87.8 88.3 - 84.5 %   Platelets 175 150 - 450 x10E3/uL  Basic Metabolic Panel (BMET)     Status: None   Collection Time: 05/20/24  9:30 AM  Result Value Ref Range   Glucose 88 70 - 99 mg/dL   BUN 17 6 - 24 mg/dL   Creatinine, Ser 9.02 0.76 - 1.27 mg/dL   eGFR 96 >40 fO/fpw/8.26   BUN/Creatinine Ratio 18 9 - 20   Sodium 141 134 - 144 mmol/L   Potassium 4.6 3.5 - 5.2 mmol/L   Chloride 105 96 - 106 mmol/L   CO2 23 20 - 29 mmol/L   Calcium 9.0 8.7 - 10.2 mg/dL  Magnesium     Status: None   Collection Time: 05/20/24  9:30 AM  Result Value Ref Range   Magnesium 2.3 1.6 - 2.3 mg/dL  IgE Nut Prof. w/Component Rflx     Status: None   Collection Time: 06/11/24  9:43 AM  Result Value Ref Range   Class Description Allergens Comment     Comment:     Levels of Specific IgE       Class  Description of Class     ---------------------------  -----  --------------------                    < 0.10         0         Negative            0.10 -    0.31         0/I       Equivocal/Low            0.32 -    0.55  I         Low            0.56 -    1.40         II        Moderate            1.41 -    3.90         III       High            3.91 -   19.00         IV        Very High           19.01 -  100.00         V         Very High                   >100.00         VI        Very High    F017-IgE Hazelnut ([place]) <0.10 Class 0 kU/L   F256-IgE Walnut <0.10 Class 0 kU/L   F202-IgE Cashew Nut <0.10 Class 0 kU/L   F018-IgE Brazil Nut <0.10 Class 0 kU/L   Peanut, IgE <0.10  Class 0 kU/L   Macadamia Nut, IgE <0.10 Class 0 kU/L   Pecan Nut IgE <0.10 Class 0 kU/L   F203-IgE Pistachio Nut <0.10 Class 0 kU/L   F020-IgE Almond <0.10 Class 0 kU/L  Corn IgE     Status: None   Collection Time: 06/11/24  9:43 AM  Result Value Ref Range   Allergen Corn, IgE <0.10 Class 0 kU/L  Allergen, Bean Black     Status: None   Collection Time: 06/11/24  9:43 AM  Result Value Ref Range   Black Bean*, IgE <0.35 <0.35 kU/L   Class Interpretation 0     Comment: CLASS INTERPRETATION: <0.35 kU/L=0, Below Detection; 0.35-0.69 kU/L= 1, Low Positive; 0.70-3.49 kU/L= 2, Moderate Positive; 3.50-17.49 kU/L= 3, Positive; 17.50-49.99 kU/L= 4, Strong Positive; >49.99 kU/L= 5, Very Strong Positive *This test was developed and its performance characteristics determined by [facility]. It has not been cleared or approved by the [place]. Food and Drug Administration.  FLAG Interpretation: A = Abnormal, H = High, L = Low   Allergen, Landy Freshwater, [id number]     Status: None   Collection Time: 06/11/24  9:43 AM  Result Value Ref Range   Allergen Green Bean IgE <0.10 Class 0 kU/L  Allergen, Kidney Bean, Rf287     Status: None   Collection Time: 06/11/24  9:43 AM  Result Value Ref Range   Kidney Bean IgE <0.10 Class 0 kU/L  Allergen Pea f12     Status: None   Collection Time: 06/11/24  9:43 AM  Result Value Ref Range   Allergen Green Pea IgE <0.10 Class 0 kU/L  Allergen, Chick Pea, Rf309, IgE     Status: None   Collection Time: 06/11/24  9:43 AM  Result Value Ref Range   F309-IgE Chick Pea <0.10 Class 0 kU/L  Egg Component Panel     Status: None   Collection Time: 06/11/24  9:43 AM  Result Value Ref Range   F232-IgE Ovalbumin <0.10 Class 0 kU/L   F233-IgE Ovomucoid <0.10 Class 0 kU/L  Allergen Egg Yolk f75     Status: None   Collection Time: 06/11/24  9:43 AM  Result Value Ref Range  IgE Egg (Yolk) <0.10 Class 0 kU/L  IgE     Status: None   Collection Time: 06/11/24  9:43 AM  Result  Value Ref Range   IgE (Immunoglobulin E), Serum 58 6 - 495 IU/mL  Wheat IgE     Status: Abnormal   Collection Time: 06/11/24  9:43 AM  Result Value Ref Range   Wheat IgE 0.26 (A) Class 0/I kU/L  Tryptase     Status: None   Collection Time: 06/11/24  9:43 AM  Result Value Ref Range   Tryptase 4.6 2.2 - 13.2 ug/L  Coconut IgE     Status: None   Collection Time: 06/11/24  9:43 AM  Result Value Ref Range   Allergen Coconut IgE <0.10 Class 0 kU/L  Allergen,Oat,f7     Status: None   Collection Time: 06/11/24  9:43 AM  Result Value Ref Range   Allergen Oat IgE <0.10 Class 0 kU/L  Allergen Sesame f10     Status: None   Collection Time: 06/11/24  9:43 AM  Result Value Ref Range   Sesame Seed IgE <0.10 Class 0 kU/L  Tomato IgE     Status: None   Collection Time: 06/11/24  9:43 AM  Result Value Ref Range   Allergen Tomato, IgE <0.10 Class 0 kU/L    Comment:     Levels of Specific IgE       Class  Description of Class     ---------------------------  -----  --------------------                    < 0.10         0         Negative            0.10 -    0.31         0/I       Equivocal/Low            0.32 -    0.55         I         Low            0.56 -    1.40         II        Moderate            1.41 -    3.90         III       High            3.91 -   19.00         IV        Very High           19.01 -  100.00         V         Very High                   >100.00         VI        Very High   ECHOCARDIOGRAM COMPLETE     Status: None   Collection Time: 07/04/24 10:01 AM  Result Value Ref Range   Area-P 1/2 3.24 cm2   S' Lateral 3.30 cm   Est EF 55 - 60%         Beverley Adine Hummer, MD, MS "

## 2024-07-07 DIAGNOSIS — F5101 Primary insomnia: Secondary | ICD-10-CM | POA: Insufficient documentation

## 2024-07-07 DIAGNOSIS — F418 Other specified anxiety disorders: Secondary | ICD-10-CM | POA: Insufficient documentation

## 2024-07-07 DIAGNOSIS — R079 Chest pain, unspecified: Secondary | ICD-10-CM | POA: Insufficient documentation

## 2024-07-08 NOTE — Telephone Encounter (Signed)
 Form submitted to plan via fax along with chart notes/labs attached.

## 2024-07-08 NOTE — Telephone Encounter (Signed)
 Fax form received for additional information-working on submission.

## 2024-07-09 NOTE — Progress Notes (Unsigned)
 "   Cardiology Clinic Note   Patient Name: Darrell Jordan Date of Encounter: 07/10/2024  Primary Care Provider:  Sebastian Beverley NOVAK, MD Primary Cardiologist:  Kardie Tobb, DO  Patient Profile    Darrell Jordan 49 year old male presents to the clinic for follow-up evaluation of his palpitations and chest pain.  Past Medical History    Past Medical History:  Diagnosis Date   Allergy 1980   Depression 1990   Mild intermittent asthma, uncomplicated 11/28/2020   Rheumatoid arthritis (HCC)    Past Surgical History:  Procedure Laterality Date   VASECTOMY  2010    Allergies  Allergies  Allergen Reactions   Peanut-Containing Drug Products Anaphylaxis    peanut allergenic extract   Soy Protein Anaphylaxis    soy   Gabapentin    Soy Allergy (Obsolete)    Sulfasalazine    Wheat     History of Present Illness    Darrell Jordan has a PMH of chronic rhinitis, and intermittent mild asthma, depression, nausea, hyperlipidemia, palpitations, and chronic fatigue.  He reports family history of cardiovascular disease in his maternal grandmother having coronary artery disease and valve replacement.  His mom had atrial fibrillation.  He reports that his father side had trouble with alcohol abuse and tobacco abuse.SABRA  He was referred by his PCP for evaluation of palpitations and chest pain.  He was seen and evaluated by his PCP on 11/09/2023.  He reported chest pain and palpitations over the prior month.  He noted pain in the center of his chest.  He described the pain as pressure with occasional fluttering and sensation radiating to his neck.  He had no associated dyspnea and denied exertional triggers.  He did report a family history of early MI.  Echocardiogram was ordered and showed normal LVEF, no wall motion abnormality, and no valvular abnormalities.  Calcium scoring and cardiac event monitor were also recommended but not completed.  He was noted to have tenderness in his chest which was felt to be  related to possible costochondritis.  Discussion for referring to cardiology was reviewed.  Inflammatory markers ESR CRP were within normal limits.  He was referred to cardiology for further evaluation.  He follows a plant-based diet.  He presented to the clinic 05/20/24 for evaluation and stated he intermittently felt a heart flutter that would happen about 1 time per month.  He reported that he started noticing discomfort in the spring.  He had echocardiogram 12/21/2023 which showed small pericardial effusion.  He reported that his pressure felt worse with slight decline such as sitting on the couch.  He does exercise regularly.  We reviewed options for further prognostication.  He also noted that he has a autoimmune disorder.  He was previously worked up for RA and referred to rheumatology.  Rheumatology did not feel that his symptoms were related to RA.  He stopped treatment in 2019.  His symptoms had been much better with strict diet.  He avoids soy, peanuts and reports that he recently had an allergic reaction to sesame oil.  He regularly takes Zyrtec.    I  ordered repeat echocardiogram, CBC, BMP, and magnesium.  Previously his TSH was normal.  We will plan follow-up in 1 month.  His echocardiogram showed an LVEF of 55 to 60%, normal RV function, normal atria, trivial pericardial effusion trivial mitral valve regurgitation, trivial tricuspid valve regurgitation and no other abnormalities.  His lab work was reassuring.  He presents to the clinic today for follow-up  evaluation.  He states he has had intermittent episodes of chest pressure.  These have been present intermittently.  He notes that the pain was more severe about 2 weeks ago.  He did attribute this to anxiety.  He also reports random episodes of shortness of breath.  We compared his previous echocardiogram and his most recent echocardiogram which are very reassuring.  We reviewed his lab work.  I did offer coronary CTA and review shared  decision making to defer that at this time.  He is able to do physical tasks such as carrying heavy bags of dog food and lumbar without chest pain.  I will have him follow-up in 2 years for reevaluation and we discussed the possibility of repeating his echocardiogram in 5 years and as needed.  Today he  lower extremity edema, fatigue, palpitations, melena, hematuria, hemoptysis, diaphoresis, weakness, presyncope, syncope, orthopnea, and PND.   Home Medications    Prior to Admission medications   Medication Sig Start Date End Date Taking? Authorizing Provider  albuterol  (VENTOLIN  HFA) 108 (90 Base) MCG/ACT inhaler Inhale 1-2 puffs into the lungs every 6 (six) hours as needed for wheezing or shortness of breath. 06/13/23   Christopher Savannah, PA-C  azelastine  (ASTELIN ) 0.1 % nasal spray Place 2 sprays into both nostrils 2 (two) times daily. 08/26/22   Sebastian Beverley NOVAK, MD  Cetirizine HCl (ZYRTEC ALLERGY PO) Take by mouth.    [provider]  Cholecalciferol (VITAMIN D3 PO) Take by mouth daily.    [provider]  EPINEPHrine  0.3 mg/0.3 mL IJ SOAJ injection Inject 0.3 mg into the muscle as needed for anaphylaxis. 11/18/21   Iva Marty Saltness, MD  fluticasone  (FLONASE ) 50 MCG/ACT nasal spray Place 2 sprays into both nostrils daily. 08/26/22   Sebastian Beverley NOVAK, MD  QUERCETIN PO Take by mouth daily.    [provider]  tiZANidine  (ZANAFLEX ) 2 MG tablet Take 1 tablet (2 mg total) by mouth at bedtime. 10/30/23   Claudene Arthea HERO, DO  traZODone  (DESYREL ) 50 MG tablet TAKE 1 TABLET BY MOUTH EVERYDAY AT BEDTIME 04/01/24   Sebastian Beverley NOVAK, MD  Vitamin D , Ergocalciferol , (DRISDOL ) 1.25 MG (50000 UNIT) CAPS capsule Take 1 capsule (50,000 Units total) by mouth every 7 (seven) days. 11/14/23 11/13/24  Sebastian Beverley NOVAK, MD    Family History    Family History  Problem Relation Age of Onset   Hypotension Mother    Atrial fibrillation Mother    Other Father        joint pain, undiagnosed    Prostate cancer Father 68   Bone cancer Father        mets from prostate cancer   Arthritis Father    Cancer Father    Rheum arthritis Brother 50   Lupus Paternal Aunt    Heart disease Maternal Grandmother        valve replacement   Heart attack Maternal Grandfather 40   Esophageal cancer Paternal Grandmother    Heart disease Paternal Grandmother    Alcohol abuse Paternal Grandfather    Emphysema Paternal Grandfather    Colon polyps Neg Hx    Colon cancer Neg Hx    Stomach cancer Neg Hx    Rectal cancer Neg Hx    He indicated that his mother is alive. He indicated that his father is deceased. He indicated that his sister is alive. He indicated that his brother is alive. He indicated that his maternal grandmother is deceased. He indicated that his  maternal grandfather is deceased. He indicated that his paternal grandmother is deceased. He indicated that his paternal grandfather is deceased. He indicated that his paternal aunt is alive. He indicated that the status of his neg hx is unknown.  Social History    Social History   Socioeconomic History   Marital status: Married    Spouse name: Not on file   Number of children: Not on file   Years of education: Not on file   Highest education level: Not on file  Occupational History   Not on file  Tobacco Use   Smoking status: Former    Current packs/day: 0.00    Average packs/day: 1 pack/day for 21.0 years (21.0 ttl pk-yrs)    Types: Cigarettes    Start date: 72    Quit date: 06/27/2017    Years since quitting: 7.0    Passive exposure: Never   Smokeless tobacco: Never  Vaping Use   Vaping status: Never Used  Substance and Sexual Activity   Alcohol use: Not Currently   Drug use: Not Currently   Sexual activity: Yes    Birth control/protection: Surgical  Other Topics Concern   Not on file  Social History Narrative   Not on file   Social Drivers of Health   Tobacco Use: Medium Risk (07/10/2024)   Patient History     Smoking Tobacco Use: Former    Smokeless Tobacco Use: Never    Passive Exposure: Never  Programmer, Applications: Not on [employer] Insecurity: Not on file  Transportation Needs: Not on file  Physical Activity: Not on file  Stress: Not on file  Social Connections: Not on file  Intimate Partner Violence: Not on file  Depression (PHQ2-9): Low Risk (07/05/2024)   Depression (PHQ2-9)    PHQ-2 Score: 0  Alcohol Screen: Not on file  Housing: Not on file  Utilities: Not on file  Health Literacy: Not on file     Review of Systems    General:  No chills, fever, night sweats or weight changes.  Cardiovascular:  No chest pain, dyspnea on exertion, edema, orthopnea, palpitations, paroxysmal nocturnal dyspnea. Dermatological: No rash, lesions/masses Respiratory: No cough, dyspnea Urologic: No hematuria, dysuria Abdominal:   No nausea, vomiting, diarrhea, bright red blood per rectum, melena, or hematemesis Neurologic:  No visual changes, wkns, changes in mental status. All other systems reviewed and are otherwise negative except as noted above.  Physical Exam    VS:  BP 116/68   Pulse 65   Ht 5' 11 (1.803 m)   Wt 204 lb (92.5 kg)   SpO2 97%   BMI 28.45 kg/m  , BMI Body mass index is 28.45 kg/m. GEN: Well nourished, well developed, in no acute distress. HEENT: normal. Neck: Supple, no JVD, carotid bruits, or masses. Cardiac: RRR, no murmurs, rubs, or gallops. No clubbing, cyanosis, edema.  Radials/DP/PT 2+ and equal bilaterally.  Respiratory:  Respirations regular and unlabored, clear to auscultation bilaterally. GI: Soft, nontender, nondistended, BS + x 4. MS: no deformity or atrophy. Skin: warm and dry, no rash. Neuro:  Strength and sensation are intact. Psych: Normal affect.  Accessory Clinical Findings    Recent Labs: 11/09/2023: ALT 22; TSH 1.14 05/20/2024: BUN 17; Creatinine, Ser 0.97; Hemoglobin 15.0; Magnesium 2.3; Platelets 175; Potassium 4.6; Sodium 141   Recent  Lipid Panel    Component Value Date/Time   CHOL 183 11/09/2023 1554   TRIG 202.0 (H) 11/09/2023 1554   HDL 41.70 11/09/2023 1554  CHOLHDL 4 11/09/2023 1554   VLDL 40.4 (H) 11/09/2023 1554   LDLCALC 101 (H) 11/09/2023 1554   LDLCALC 93 11/17/2021 1520         ECG personally reviewed by me today-   none today.   Echocardiogram 12/21/2023    IMPRESSIONS     1. Left ventricular ejection fraction, by estimation, is 60 to 65%. The  left ventricle has normal function. The left ventricle has no regional  wall motion abnormalities. Left ventricular diastolic parameters were  normal.   2. Right ventricular systolic function is normal. The right ventricular  size is normal.   3. A small pericardial effusion is present.   4. The mitral valve is normal in structure. No evidence of mitral valve  regurgitation. No evidence of mitral stenosis.   5. The aortic valve is normal in structure. Aortic valve regurgitation is  not visualized. No aortic stenosis is present.   6. The inferior vena cava is normal in size with greater than 50%  respiratory variability, suggesting right atrial pressure of 3 mmHg.   FINDINGS   Left Ventricle: Left ventricular ejection fraction, by estimation, is 60  to 65%. The left ventricle has normal function. The left ventricle has no  regional wall motion abnormalities. The left ventricular internal cavity  size was normal in size. There is   no left ventricular hypertrophy. Left ventricular diastolic parameters  were normal.   Right Ventricle: The right ventricular size is normal. No increase in  right ventricular wall thickness. Right ventricular systolic function is  normal.   Left Atrium: Left atrial size was normal in size.   Right Atrium: Right atrial size was normal in size.   Pericardium: A small pericardial effusion is present.   Mitral Valve: The mitral valve is normal in structure. No evidence of  mitral valve regurgitation. No evidence of  mitral valve stenosis.   Tricuspid Valve: The tricuspid valve is normal in structure. Tricuspid  valve regurgitation is not demonstrated. No evidence of tricuspid  stenosis.   Aortic Valve: The aortic valve is normal in structure. Aortic valve  regurgitation is not visualized. No aortic stenosis is present.   Pulmonic Valve: The pulmonic valve was normal in structure. Pulmonic valve  regurgitation is trivial. No evidence of pulmonic stenosis.   Aorta: The aortic root is normal in size and structure.   Venous: The inferior vena cava is normal in size with greater than 50%  respiratory variability, suggesting right atrial pressure of 3 mmHg.   IAS/Shunts: No atrial level shunt detected by color flow Doppler.   Additional Comments: 3D was performed not requiring image post processing  on an independent workstation and was normal.   Echocardiogram 07/04/2024  IMPRESSIONS     1. Left ventricular ejection fraction, by estimation, is 55 to 60%. Left  ventricular ejection fraction by 3D volume is 60 %. The left ventricle has  normal function. The left ventricle has no regional wall motion  abnormalities. Left ventricular diastolic   parameters were normal. The average left ventricular global longitudinal  strain is -20.4 %. The global longitudinal strain is normal.   2. Right ventricular systolic function is normal. The right ventricular  size is normal.   3. The mitral valve is normal in structure. Trivial mitral valve  regurgitation. No evidence of mitral stenosis.   4. The aortic valve is normal in structure. Aortic valve regurgitation is  not visualized. No aortic stenosis is present.   5. The inferior vena  cava is normal in size with greater than 50%  respiratory variability, suggesting right atrial pressure of 3 mmHg.   FINDINGS   Left Ventricle: Left ventricular ejection fraction, by estimation, is 55  to 60%. Left ventricular ejection fraction by 3D volume is 60 %. The left   ventricle has normal function. The left ventricle has no regional wall  motion abnormalities. The average  left ventricular global longitudinal strain is -20.4 %. Strain was  performed and the global longitudinal strain is normal. The left  ventricular internal cavity size was normal in size. There is no left  ventricular hypertrophy. Left ventricular diastolic  parameters were normal.   Right Ventricle: The right ventricular size is normal. No increase in  right ventricular wall thickness. Right ventricular systolic function is  normal.   Left Atrium: Left atrial size was normal in size.   Right Atrium: Right atrial size was normal in size.   Pericardium: Trivial pericardial effusion is present. The pericardial  effusion is posterior to the left ventricle.   Mitral Valve: The mitral valve is normal in structure. Trivial mitral  valve regurgitation. No evidence of mitral valve stenosis.   Tricuspid Valve: The tricuspid valve is normal in structure. Tricuspid  valve regurgitation is trivial. No evidence of tricuspid stenosis.   Aortic Valve: The aortic valve is normal in structure. Aortic valve  regurgitation is not visualized. No aortic stenosis is present.   Pulmonic Valve: The pulmonic valve was normal in structure. Pulmonic valve  regurgitation is trivial. No evidence of pulmonic stenosis.   Aorta: The aortic root is normal in size and structure.   Venous: The inferior vena cava is normal in size with greater than 50%  respiratory variability, suggesting right atrial pressure of 3 mmHg.   IAS/Shunts: No atrial level shunt detected by color flow Doppler.   Assessment & Plan   1.  Chest pain-heart rate today 65.  Continues to note intermittent episodes of  chest pain which she describes as a pressure type sensation.  Reassuring echocardiogram.  Thyroid  panel 11/09/2023 reassuring.  Lab work  reassuring.  Symptoms do not appear to be related to cardiac issues.  Did offer  coronary CTA, used shared decision making to decide to defer ischemic evaluation at this time. Patient reassured  Palpitations-stable.  Continues to note palpitations around 1 time per month.  He describes the episodes as irregular type beat.   Continue to monitor Avoid triggers caffeine, chocolate, EtOH, dehydration excetra.  Hyperlipidemia-LDL 101 on 11/09/2023. Increase physical activity as tolerated High-fiber diet Follows with PCP  Disposition: Follow-up with Dr. Sheena or me in 24 months.  Josefa HERO. Revia Nghiem NP-C     07/10/2024, 3:51 PM [facility] [address] [place], [place] [address] Office [phone]    Notice: This dictation was prepared with Dragon dictation along with smaller phrase technology. Any transcriptional errors that result from this process are unintentional and may not be corrected upon review.   I spent 14 minutes examining this patient, reviewing medications, and using patient centered shared decision making involving their cardiac care.   I spent  20 minutes reviewing past medical history,  medications, and prior cardiac tests.  "

## 2024-07-10 ENCOUNTER — Ambulatory Visit: Attending: Cardiology | Admitting: General Practice

## 2024-07-10 ENCOUNTER — Encounter: Payer: Self-pay | Admitting: General Practice

## 2024-07-10 VITALS — BP 116/68 | HR 65 | Ht 71.0 in | Wt 204.0 lb

## 2024-07-10 DIAGNOSIS — R002 Palpitations: Secondary | ICD-10-CM

## 2024-07-10 DIAGNOSIS — E782 Mixed hyperlipidemia: Secondary | ICD-10-CM

## 2024-07-10 DIAGNOSIS — R072 Precordial pain: Secondary | ICD-10-CM | POA: Diagnosis not present

## 2024-07-10 NOTE — Patient Instructions (Signed)
 Medication Instructions:  Your physician recommends that you continue on your current medications as directed. Please refer to the Current Medication list given to you today.  *If you need a refill on your cardiac medications before your next appointment, please call your pharmacy*  Follow-Up: At [facility], you and your health needs are our priority.  As part of our continuing mission to provide you with exceptional heart care, our providers are all part of one team.  This team includes your primary Cardiologist (physician) and Advanced Practice Providers or APPs (Physician Assistants and Nurse Practitioners) who all work together to provide you with the care you need, when you need it.  Your next appointment:   2 year(s)  Provider:   Kardie Tobb, DO or Josefa Beauvais, NP

## 2024-07-12 ENCOUNTER — Telehealth: Payer: Self-pay | Admitting: Pharmacist

## 2024-07-12 NOTE — Telephone Encounter (Signed)
 An expedited appeal request has been submitted for [employer]. Will advise when response is received. Appeal letter and all supporting clinical documentation has been faxed to [facility] at [phone] on 07/12/2024 @11 :27 am.  Thank you, Devere Pandy, PharmD Clinical Pharmacist  [place]  Direct Dial: [phone]

## 2024-07-12 NOTE — Telephone Encounter (Signed)
 Denial received, letter in Media. Trying appeal with anaphylaxis ICD-10 code

## 2024-07-14 ENCOUNTER — Other Ambulatory Visit (HOSPITAL_COMMUNITY): Payer: Self-pay

## 2024-07-16 ENCOUNTER — Other Ambulatory Visit (HOSPITAL_COMMUNITY): Payer: Self-pay

## 2024-07-18 NOTE — Telephone Encounter (Signed)
 Devere followed up with insurance, still pending appeal determination

## 2024-07-23 ENCOUNTER — Other Ambulatory Visit (HOSPITAL_COMMUNITY): Payer: Self-pay

## 2024-07-24 ENCOUNTER — Other Ambulatory Visit (HOSPITAL_COMMUNITY): Payer: Self-pay

## 2024-07-24 ENCOUNTER — Ambulatory Visit (HOSPITAL_COMMUNITY)

## 2024-07-25 NOTE — Telephone Encounter (Signed)
 Appeal was received on 07/12/2024 and is still under review. It is pi#[id number] and is with analyst M.A.  Her phone number is [phone].  Left her a msg to see if there is an estimated decision date.

## 2024-07-30 ENCOUNTER — Other Ambulatory Visit (HOSPITAL_COMMUNITY): Payer: Self-pay

## 2024-07-31 NOTE — Telephone Encounter (Signed)
 Left another message asking about appeal status.  Phone #[phone]

## 2024-08-01 ENCOUNTER — Ambulatory Visit: Admitting: Family Medicine

## 2024-08-02 ENCOUNTER — Other Ambulatory Visit: Payer: Self-pay | Admitting: Family Medicine

## 2024-08-02 DIAGNOSIS — F17298 Nicotine dependence, other tobacco product, with other nicotine-induced disorders: Secondary | ICD-10-CM

## 2024-10-02 ENCOUNTER — Ambulatory Visit: Admitting: Family Medicine

## 2025-06-12 ENCOUNTER — Ambulatory Visit: Admitting: Allergy & Immunology
